# Patient Record
Sex: Female | Born: 1960 | Race: White | Hispanic: No | Smoking: Never smoker
Health system: Southern US, Community
[De-identification: ages and names within clinical notes are randomized; demographics above are authoritative.]

## PROBLEM LIST (undated history)

## (undated) DIAGNOSIS — E8809 Other disorders of plasma-protein metabolism, not elsewhere classified: Secondary | ICD-10-CM

## (undated) DIAGNOSIS — G35 Multiple sclerosis: Secondary | ICD-10-CM

## (undated) DIAGNOSIS — G35D Multiple sclerosis, unspecified: Secondary | ICD-10-CM

## (undated) DIAGNOSIS — Z87448 Personal history of other diseases of urinary system: Secondary | ICD-10-CM

## (undated) DIAGNOSIS — N816 Rectocele: Secondary | ICD-10-CM

## (undated) DIAGNOSIS — G43909 Migraine, unspecified, not intractable, without status migrainosus: Secondary | ICD-10-CM

## (undated) DIAGNOSIS — J45909 Unspecified asthma, uncomplicated: Secondary | ICD-10-CM

## (undated) HISTORY — PX: CHOLECYSTECTOMY: SHX55

## (undated) HISTORY — PX: ABDOMINAL HYSTERECTOMY: SHX81

---

## 2017-04-02 ENCOUNTER — Emergency Department (HOSPITAL_BASED_OUTPATIENT_CLINIC_OR_DEPARTMENT_OTHER)
Admission: EM | Admit: 2017-04-02 | Discharge: 2017-04-02 | Disposition: A | Discharge status: Home / Self Care | Payer: Medicare Other | Attending: Emergency Medicine | Admitting: Emergency Medicine

## 2017-04-02 ENCOUNTER — Other Ambulatory Visit: Payer: Self-pay

## 2017-04-02 ENCOUNTER — Encounter (HOSPITAL_BASED_OUTPATIENT_CLINIC_OR_DEPARTMENT_OTHER): Payer: Self-pay | Admitting: Emergency Medicine

## 2017-04-02 ENCOUNTER — Emergency Department (HOSPITAL_BASED_OUTPATIENT_CLINIC_OR_DEPARTMENT_OTHER): Payer: Medicare Other

## 2017-04-02 DIAGNOSIS — N390 Urinary tract infection, site not specified: Secondary | ICD-10-CM | POA: Insufficient documentation

## 2017-04-02 DIAGNOSIS — R109 Unspecified abdominal pain: Secondary | ICD-10-CM | POA: Diagnosis not present

## 2017-04-02 DIAGNOSIS — R52 Pain, unspecified: Secondary | ICD-10-CM

## 2017-04-02 DIAGNOSIS — R51 Headache: Secondary | ICD-10-CM

## 2017-04-02 DIAGNOSIS — R519 Headache, unspecified: Secondary | ICD-10-CM

## 2017-04-02 DIAGNOSIS — R110 Nausea: Secondary | ICD-10-CM

## 2017-04-02 HISTORY — DX: Multiple sclerosis, unspecified: G35.D

## 2017-04-02 HISTORY — DX: Migraine, unspecified, not intractable, without status migrainosus: G43.909

## 2017-04-02 HISTORY — DX: Multiple sclerosis: G35

## 2017-04-02 LAB — COMPREHENSIVE METABOLIC PANEL
ALBUMIN: 3.9 g/dL (ref 3.5–5.0)
ALK PHOS: 70 U/L (ref 38–126)
ALT: 15 U/L (ref 14–54)
AST: 21 U/L (ref 15–41)
Anion gap: 11 (ref 5–15)
BILIRUBIN TOTAL: 0.4 mg/dL (ref 0.3–1.2)
BUN: 11 mg/dL (ref 6–20)
CALCIUM: 9.2 mg/dL (ref 8.9–10.3)
CO2: 26 mmol/L (ref 22–32)
Chloride: 105 mmol/L (ref 101–111)
Creatinine, Ser: 0.74 mg/dL (ref 0.44–1.00)
GFR calc Af Amer: >60 mL/min (ref >60)
GFR calc non Af Amer: >60 mL/min (ref >60)
GLUCOSE: 100 mg/dL — AB (ref 65–99)
Potassium: 3.3 mmol/L — ABNORMAL LOW (ref 3.5–5.1)
Sodium: 142 mmol/L (ref 135–145)
TOTAL PROTEIN: 7.7 g/dL (ref 6.5–8.1)

## 2017-04-02 LAB — URINALYSIS, ROUTINE W REFLEX MICROSCOPIC
Bilirubin Urine: NEGATIVE
GLUCOSE, UA: NEGATIVE mg/dL
KETONES UR: NEGATIVE mg/dL
Nitrite: NEGATIVE
PROTEIN: NEGATIVE mg/dL
Specific Gravity, Urine: 1.015 (ref 1.005–1.030)
pH: 6 (ref 5.0–8.0)

## 2017-04-02 LAB — CK: Total CK: 59 U/L (ref 38–234)

## 2017-04-02 LAB — CBC WITH DIFFERENTIAL/PLATELET
BASOS ABS: 0 10*3/uL (ref 0.0–0.1)
BASOS PCT: 0 %
Eosinophils Absolute: 0.2 10*3/uL (ref 0.0–0.7)
Eosinophils Relative: 3 %
HEMATOCRIT: 40.6 % (ref 36.0–46.0)
HEMOGLOBIN: 12.9 g/dL (ref 12.0–15.0)
Lymphocytes Relative: 25 %
Lymphs Abs: 2.4 10*3/uL (ref 0.7–4.0)
MCH: 28.7 pg (ref 26.0–34.0)
MCHC: 31.8 g/dL (ref 30.0–36.0)
MCV: 90.4 fL (ref 78.0–100.0)
Monocytes Absolute: 0.8 10*3/uL (ref 0.1–1.0)
Monocytes Relative: 9 %
NEUTROS ABS: 6 10*3/uL (ref 1.7–7.7)
NEUTROS PCT: 63 %
Platelets: 333 10*3/uL (ref 150–400)
RBC: 4.49 MIL/uL (ref 3.87–5.11)
RDW: 14.7 % (ref 11.5–15.5)
WBC: 9.5 10*3/uL (ref 4.0–10.5)

## 2017-04-02 LAB — TROPONIN I

## 2017-04-02 LAB — URINALYSIS, MICROSCOPIC (REFLEX)

## 2017-04-02 MED ORDER — PROMETHAZINE HCL 25 MG PO TABS
25.0000 mg | ORAL_TABLET | Freq: Once | ORAL | Status: AC
  Administered 2017-04-02: 25 mg via ORAL
  Filled 2017-04-02: qty 1

## 2017-04-02 MED ORDER — DIPHENHYDRAMINE HCL 50 MG/ML IJ SOLN
25.0000 mg | Freq: Once | INTRAMUSCULAR | Status: AC
  Administered 2017-04-02: 25 mg via INTRAVENOUS
  Filled 2017-04-02: qty 1

## 2017-04-02 MED ORDER — TERCONAZOLE 0.8 % VA CREA: 1.0000 | TOPICAL_CREAM | Freq: Every day | VAGINAL | 0 refills | Status: AC

## 2017-04-02 MED ORDER — DEXAMETHASONE SODIUM PHOSPHATE 10 MG/ML IJ SOLN
10.0000 mg | Freq: Once | INTRAMUSCULAR | Status: AC
  Administered 2017-04-02: 10 mg via INTRAVENOUS
  Filled 2017-04-02: qty 1

## 2017-04-02 MED ORDER — CEPHALEXIN 500 MG PO CAPS: 500.0000 mg | ORAL_CAPSULE | Freq: Three times a day (TID) | ORAL | 0 refills | Status: AC

## 2017-04-02 MED ORDER — SODIUM CHLORIDE 0.9 % IV BOLUS (SEPSIS)
1000.0000 mL | Freq: Once | INTRAVENOUS | Status: AC
  Administered 2017-04-02: 1000 mL via INTRAVENOUS

## 2017-04-02 NOTE — ED Notes (Signed)
Pt on monitor 

## 2017-04-02 NOTE — ED Notes (Signed)
Patient reports headache 6/10.  Reports back pain remains 10/10.

## 2017-04-02 NOTE — ED Provider Notes (Signed)
[facility] EMERGENCY DEPARTMENT Provider Note   CSN: [id number] Arrival date & time: 04/02/17  1249     History   Chief Complaint Chief Complaint  Patient presents with  . Headache    HPI Valerie Juarez is a 56 y.o. female.  HPI  Reports diffuse aching, from lower head to back. Reports feels like flu but has not had fever.  Reports with coughing pain in back and head is worse. Has hx of chronic daily migraines and MS.  Wednesday night ate apples and ham, had reaction to one or the other, had itching. No nausea, vomiting or diarrhea. Took benadryl before bed. Had different type of headache then typical, aching and headache Thursday. Took toradol shot which helped headache but not aches.  Has not been eating much. Today, woke up feeling sicker.  Worsening aching.  Hx of sinus and allergy problems, cough is similar.  No shortness of breath, no diarrhea. Has had nausea.  No urinary symptoms. No fevers.  Feels sick but can't point at any particular thing.  Reports diffuse aching that is worse in flank bilaterally.  No recent medication changes.     Past Medical History:  Diagnosis Date  . Migraines   . MS (multiple sclerosis) (HCC)     There are no active problems to display for this patient.   Past Surgical History:  Procedure Laterality Date  . ABDOMINAL HYSTERECTOMY    . CHOLECYSTECTOMY      OB History    No data available       Home Medications    Prior to Admission medications   Medication Sig Start Date End Date Taking? Authorizing Provider  cetirizine (ZYRTEC) 10 MG tablet Take 10 mg by mouth daily.   Yes [provider]  clobetasol (OLUX) 0.05 % topical foam Apply topically at bedtime.   Yes [provider]  clonazePAM (KLONOPIN) 1 MG tablet Take 1 mg by mouth 2 (two) times daily as needed for anxiety (for severe headache).   Yes [provider]  diazepam (VALIUM) 5 MG tablet Take 5 mg by mouth every 6 (six) hours as needed  for anxiety.   Yes [provider]  diphenhydrAMINE (BENADRYL) 50 MG capsule Take 50 mg by mouth every 6 (six) hours as needed (anxiety and allergic reactions).   Yes [provider]  EPINEPHrine 0.3 mg/0.3 mL IJ SOAJ injection Inject into the muscle once.   Yes [provider]  esomeprazole (NEXIUM) 40 MG capsule Take 40 mg by mouth 2 (two) times daily before a meal.   Yes [provider]  estradiol (CLIMARA - DOSED IN MG/24 HR) 0.025 mg/24hr patch Place 0.25 mg onto the skin once a week.   Yes [provider]  famotidine (PEPCID) 40 MG tablet Take 40 mg by mouth as needed for heartburn or indigestion.   Yes [provider]  fluorometholone (FML) 0.1 % ophthalmic suspension Place 1 drop into both eyes daily.   Yes [provider]  fluticasone (FLOVENT DISKUS) 50 MCG/BLIST diskus inhaler Inhale 1 puff into the lungs 2 (two) times daily.   Yes [provider]  ketorolac (TORADOL) 10 MG tablet 60 mg every 6 (six) hours as needed for severe pain (this is for severe headache and is IM injection).   Yes [provider]  levalbuterol Pauline Aus(XOPENEX HFA) 45 MCG/ACT inhaler Inhale into the lungs every 4 (four) hours as needed for wheezing or shortness of breath.   Yes [provider]  levalbuterol (XOPENEX) 1.25 MG/3ML nebulizer solution Take 1.25 mg by nebulization every 4 (four) hours as needed for wheezing.   Yes [provider]  metroNIDAZOLE (METROGEL) 1 % gel Apply topically at bedtime as needed (alternate with metro cream).   Yes [provider]  montelukast (SINGULAIR) 10 MG tablet Take 10 mg by mouth at bedtime.   Yes [provider]  propranolol (INDERAL) 10 MG tablet Take 20 mg by mouth every evening.   Yes [provider]  propranolol (INDERAL) 80 MG tablet Take 80 mg by mouth daily.   Yes [provider]  ranitidine (ZANTAC) 150 MG tablet Take 150 mg by mouth 2 (two)  times daily.   Yes [provider]  tretinoin (RETIN-A) 0.025 % gel Apply topically at bedtime.   Yes [provider]  triamcinolone cream (KENALOG) 0.1 % Apply 1 application topically 2 (two) times daily.   Yes [provider]  vitamin B-12 (CYANOCOBALAMIN) 1000 MCG tablet Take 1,000 mcg by mouth daily.   Yes [provider]  VITAMIN D, ERGOCALCIFEROL, PO Take 5,000 Units by mouth daily.   Yes [provider]  cephALEXin (KEFLEX) 500 MG capsule Take 1 capsule (500 mg total) by mouth 3 (three) times daily for 10 days. 04/02/17 04/12/17  Alvira Monday, MD  terconazole (TERAZOL 3) 0.8 % vaginal cream Place 1 applicator vaginally at bedtime for 3 days. 04/02/17 04/05/17  Alvira Monday, MD    Family History No family history on file.  Social History Social History   Tobacco Use  . Smoking status: Never Smoker  . Smokeless tobacco: Never Used  Substance Use Topics  . Alcohol use: No    Frequency: Never  . Drug use: No     Allergies   Atenolol; Codeine; Contrast media [iodinated diagnostic agents]; Desyrel [trazodone]; Morphine and related; Premarin [conjugated estrogens]; Prozac [fluoxetine hcl]; and Tetanus toxoids   Review of Systems Review of Systems  Constitutional: Negative for fever.  HENT: Positive for congestion (chronic, unchanged). Negative for sore throat.   Eyes: Negative for visual disturbance.  Respiratory: Positive for cough (has chronic, unchanged). Negative for shortness of breath.   Cardiovascular: Negative for chest pain.  Gastrointestinal: Positive for nausea. Negative for abdominal pain, constipation, diarrhea and vomiting.  Genitourinary: Positive for flank pain. Negative for difficulty urinating and dysuria.  Musculoskeletal: Positive for back pain and myalgias. Negative for neck pain.  Skin: Negative for rash.  Neurological: Positive for headaches. Negative for syncope, facial asymmetry, weakness and numbness.      Physical Exam Updated Vital Signs BP 139/79   Pulse 89   Temp 98.3 F (36.8 C) (Oral)   Resp 18   Ht 5' 4.5" (1.638 m)   Wt 104.3 kg (230 lb)   SpO2 100%   BMI 38.87 kg/m   Physical Exam  Constitutional: She is oriented to person, place, and time. She appears well-developed and well-nourished. No distress.  HENT:  Head: Normocephalic and atraumatic.  Eyes: Conjunctivae and EOM are normal.  Neck: Normal range of motion.  Cardiovascular: Normal rate, regular rhythm, normal heart sounds and intact distal pulses. Exam reveals no gallop and no friction rub.  No murmur heard. Pulmonary/Chest: Effort normal and breath sounds normal. No respiratory distress. She has no wheezes. She has no rales.  Abdominal: Soft. She exhibits no distension. There is no tenderness. There is no guarding.  Bilateral CVA  Musculoskeletal: She exhibits no edema or tenderness.  Neurological: She is alert and oriented to  person, place, and time. She has normal strength. No cranial nerve deficit or sensory deficit. Coordination normal. GCS eye subscore is 4. GCS verbal subscore is 5. GCS motor subscore is 6.  Skin: Skin is warm and dry. No rash noted. She is not diaphoretic. No erythema.  Nursing note and vitals reviewed.    ED Treatments / Results  Labs (all labs ordered are listed, but only abnormal results are displayed) Labs Reviewed  COMPREHENSIVE METABOLIC PANEL - Abnormal; Notable for the following components:      Result Value   Potassium 3.3 (*)    Glucose, Bld 100 (*)    All other components within normal limits  URINALYSIS, ROUTINE W REFLEX MICROSCOPIC - Abnormal; Notable for the following components:   APPearance CLOUDY (*)    Hgb urine dipstick MODERATE (*)    Leukocytes, UA SMALL (*)    All other components within normal limits  URINALYSIS, MICROSCOPIC (REFLEX) - Abnormal; Notable for the following components:   Bacteria, UA MANY (*)    Squamous Epithelial / LPF 0-5 (*)    All  other components within normal limits  URINE CULTURE  TROPONIN I  CBC WITH DIFFERENTIAL/PLATELET  CK    EKG  EKG Interpretation  Date/Time:  Friday April 02 2017 16:05:25 EST Ventricular Rate:  74 PR Interval:    QRS Duration: 95 QT Interval:  426 QTC Calculation: 473 R Axis:   43 Text Interpretation:  Sinus rhythm Low voltage, precordial leads No significant change since last tracing Confirmed by Alvira Monday ([id number]) on 04/02/2017 5:56:19 PM       Radiology Dg Chest 2 View  Result Date: 04/02/2017 CLINICAL DATA:  MS.  Migraines.  Cough and congestion.  Body aches. EXAM: CHEST  2 VIEW COMPARISON:  No prior . FINDINGS: Mild soft tissue prominence noted along the lower aspect of the anterior mediastinum, most likely prominent mediastinal fat. CT can be obtained for confirmation. Heart size normal. Lungs are clear. No pleural effusion or pneumothorax. No acute bony abnormality. IMPRESSION: 1. Mild soft tissue fullness noted over the lower anterior mediastinum, most likely prominent mediastinal fat. CT can be obtained for confirmation. 2. No acute cardiopulmonary disease . Electronically Signed   By: Maisie Fus  Register   On: 04/02/2017 16:37    Procedures Procedures (including critical care time)  Medications Ordered in ED Medications  sodium chloride 0.9 % bolus 1,000 mL (0 mLs Intravenous Stopped 04/02/17 1811)  diphenhydrAMINE (BENADRYL) injection 25 mg (25 mg Intravenous Given 04/02/17 1607)  dexamethasone (DECADRON) injection 10 mg (10 mg Intravenous Given 04/02/17 1607)  promethazine (PHENERGAN) tablet 25 mg (25 mg Oral Given 04/02/17 1607)     Initial Impression / Assessment and Plan / ED Course  I have reviewed the triage vital signs and the nursing notes.  Pertinent labs & imaging results that were available during my care of the patient were reviewed by me and considered in my medical decision making (see chart for details).     56yo female with history of  MS, chronic headaches, presents with concern for nausea, headache, back pain, body aches.  No fever, doubt influenza. XR shows no pneumonia.  Reported aching also through chest and EKG/troponin done without acute abnormalities. No significant electrolyte abnormalities. Normal CK.  UA shows UTI. Suspect likely pyelonephritis versus other viral syndrome.  Given medication for headache, reports improved headache however presence of back pain. Not colicky pain, reports feels different than prior nephrolithiasis. Given rx for keflex. Patient discharged in stable  condition with understanding of reasons to return.   Final Clinical Impressions(s) / ED Diagnoses   Final diagnoses:  Body aches  Acute nonintractable headache, unspecified headache type  Nausea  Flank pain  Urinary tract infection without hematuria, site unspecified    ED Discharge Orders        Ordered    cephALEXin (KEFLEX) 500 MG capsule  3 times daily     04/02/17 1809    terconazole (TERAZOL 3) 0.8 % vaginal cream  Daily at bedtime     04/02/17 1816       Alvira Monday, MD 04/03/17 1258

## 2017-04-02 NOTE — ED Triage Notes (Signed)
Pt reports hx of MS and chronic daily migraines and states she feels "really sick". Pt reports a "different type headache" and generalized body aches today.

## 2017-04-03 LAB — URINE CULTURE

## 2017-04-07 ENCOUNTER — Encounter (HOSPITAL_BASED_OUTPATIENT_CLINIC_OR_DEPARTMENT_OTHER): Payer: Self-pay

## 2017-04-07 ENCOUNTER — Other Ambulatory Visit: Payer: Self-pay

## 2017-04-07 ENCOUNTER — Emergency Department (HOSPITAL_BASED_OUTPATIENT_CLINIC_OR_DEPARTMENT_OTHER)
Admission: EM | Admit: 2017-04-07 | Discharge: 2017-04-08 | Disposition: A | Discharge status: Home / Self Care | Payer: Medicare Other | Attending: Emergency Medicine | Admitting: Emergency Medicine

## 2017-04-07 DIAGNOSIS — Z79899 Other long term (current) drug therapy: Secondary | ICD-10-CM | POA: Diagnosis not present

## 2017-04-07 DIAGNOSIS — M549 Dorsalgia, unspecified: Secondary | ICD-10-CM | POA: Insufficient documentation

## 2017-04-07 DIAGNOSIS — R1030 Lower abdominal pain, unspecified: Secondary | ICD-10-CM | POA: Diagnosis present

## 2017-04-07 LAB — URINALYSIS, ROUTINE W REFLEX MICROSCOPIC
Bilirubin Urine: NEGATIVE
GLUCOSE, UA: NEGATIVE mg/dL
HGB URINE DIPSTICK: NEGATIVE
KETONES UR: NEGATIVE mg/dL
Leukocytes, UA: NEGATIVE
Nitrite: NEGATIVE
PROTEIN: NEGATIVE mg/dL
Specific Gravity, Urine: 1.01 (ref 1.005–1.030)
pH: 6.5 (ref 5.0–8.0)

## 2017-04-07 NOTE — ED Triage Notes (Addendum)
Pt c/o bilat flank pain-was dx with UTI on 12/28-states she is on meds but feels no better-NAD-slow steady gait

## 2017-04-08 ENCOUNTER — Emergency Department (HOSPITAL_BASED_OUTPATIENT_CLINIC_OR_DEPARTMENT_OTHER): Payer: Medicare Other

## 2017-04-08 LAB — CBC WITH DIFFERENTIAL/PLATELET
Basophils Absolute: 0 10*3/uL (ref 0.0–0.1)
Basophils Relative: 0 %
EOS ABS: 0.3 10*3/uL (ref 0.0–0.7)
EOS PCT: 3 %
HCT: 40 % (ref 36.0–46.0)
Hemoglobin: 13 g/dL (ref 12.0–15.0)
Lymphocytes Relative: 32 %
Lymphs Abs: 3.1 10*3/uL (ref 0.7–4.0)
MCH: 29 pg (ref 26.0–34.0)
MCHC: 32.5 g/dL (ref 30.0–36.0)
MCV: 89.3 fL (ref 78.0–100.0)
MONO ABS: 0.7 10*3/uL (ref 0.1–1.0)
Monocytes Relative: 7 %
Neutro Abs: 5.7 10*3/uL (ref 1.7–7.7)
Neutrophils Relative %: 58 %
PLATELETS: 318 10*3/uL (ref 150–400)
RBC: 4.48 MIL/uL (ref 3.87–5.11)
RDW: 14.5 % (ref 11.5–15.5)
WBC: 9.8 10*3/uL (ref 4.0–10.5)

## 2017-04-08 LAB — COMPREHENSIVE METABOLIC PANEL
ALT: 16 U/L (ref 14–54)
ANION GAP: 11 (ref 5–15)
AST: 19 U/L (ref 15–41)
Albumin: 3.6 g/dL (ref 3.5–5.0)
Alkaline Phosphatase: 67 U/L (ref 38–126)
BUN: 14 mg/dL (ref 6–20)
CHLORIDE: 101 mmol/L (ref 101–111)
CO2: 26 mmol/L (ref 22–32)
Calcium: 9.3 mg/dL (ref 8.9–10.3)
Creatinine, Ser: 0.86 mg/dL (ref 0.44–1.00)
GFR calc Af Amer: >60 mL/min (ref >60)
Glucose, Bld: 108 mg/dL — ABNORMAL HIGH (ref 65–99)
POTASSIUM: 3.6 mmol/L (ref 3.5–5.1)
SODIUM: 138 mmol/L (ref 135–145)
Total Bilirubin: 0.5 mg/dL (ref 0.3–1.2)
Total Protein: 7.2 g/dL (ref 6.5–8.1)

## 2017-04-08 MED ORDER — ONDANSETRON 8 MG PO TBDP
8.0000 mg | ORAL_TABLET | Freq: Once | ORAL | Status: AC
Start: 2017-04-08 — End: 2017-04-08
  Administered 2017-04-08: 8 mg via ORAL
  Filled 2017-04-08: qty 1

## 2017-04-08 MED ORDER — HYDROCODONE-ACETAMINOPHEN 5-325 MG PO TABS: 1.0000 | ORAL_TABLET | Freq: Four times a day (QID) | ORAL | 0 refills | Status: AC | PRN

## 2017-04-08 NOTE — ED Notes (Signed)
C/o bilateral flank pain rt worse than left,  radiating to rt upper back and rt side, w nausea and chills off and on  Was seen here dec 26 dx w uti and started on meds  Pt states she is no better

## 2017-04-08 NOTE — ED Notes (Signed)
Returned pt. Phone call for results of her urine culture.  She did not answer, message left for her to call . 04/08/2017/ 14:18

## 2017-04-08 NOTE — ED Provider Notes (Signed)
[facility] Provider Note: Valerie Dell, MD, FACEP  CSN: [id number] MRN: [id number] ARRIVAL: 04/07/17 at 2159 ROOM: [facility]   CHIEF COMPLAINT  Flank Pain   HISTORY OF PRESENT ILLNESS  04/08/17 1:08 AM Valerie Juarez is a 57 y.o. female who was seen in the ED on the 28th for flank pain and vaguely described feelings of being sick.  A workup will suggest a urinary tract infection and she was treated with Keflex.  She was also treated with steroids for chronic daily headaches.  She has had improvement in her headaches but continues to have bilateral flank pain which worsened yesterday.  The flank pain is located more medial than is typical for urinary symptoms.  The pain is dull and she rates it a 9 out of 10.  Pain is worse with movement, palpation or deep breathing.  She is not short of breath.  She has not had a fever.  The pain does make her nauseated.  She continues to feel "sick" but acknowledges she cannot verbalize what she means by this.  She has not had a fever.  She has also given herself Toradol shots for treatment of her headaches.  These have been beneficial to her head but have not relieved her back pain.  Consultation with the [facility] state controlled substances database reveals the patient has received no opioid prescriptions in the past 2 years.   Past Medical History:  Diagnosis Date  . Migraines   . MS (multiple sclerosis) (HCC)     Past Surgical History:  Procedure Laterality Date  . ABDOMINAL HYSTERECTOMY    . CHOLECYSTECTOMY      No family history on file.  Social History   Tobacco Use  . Smoking status: Never Smoker  . Smokeless tobacco: Never Used  Substance Use Topics  . Alcohol use: No    Frequency: Never  . Drug use: No    Prior to Admission medications   Medication Sig Start Date End Date Taking? Authorizing Provider  cephALEXin (KEFLEX) 500 MG capsule Take 1 capsule (500 mg total) by mouth 3 (three) times daily for 10  days. 04/02/17 04/12/17  Alvira Monday, MD  cetirizine (ZYRTEC) 10 MG tablet Take 10 mg by mouth daily.    [provider]  clobetasol (OLUX) 0.05 % topical foam Apply topically at bedtime.    [provider]  clonazePAM (KLONOPIN) 1 MG tablet Take 1 mg by mouth 2 (two) times daily as needed for anxiety (for severe headache).    [provider]  diazepam (VALIUM) 5 MG tablet Take 5 mg by mouth every 6 (six) hours as needed for anxiety.    [provider]  diphenhydrAMINE (BENADRYL) 50 MG capsule Take 50 mg by mouth every 6 (six) hours as needed (anxiety and allergic reactions).    [provider]  EPINEPHrine 0.3 mg/0.3 mL IJ SOAJ injection Inject into the muscle once.    [provider]  esomeprazole (NEXIUM) 40 MG capsule Take 40 mg by mouth 2 (two) times daily before a meal.    [provider]  estradiol (CLIMARA - DOSED IN MG/24 HR) 0.025 mg/24hr patch Place 0.25 mg onto the skin once a week.    [provider]  famotidine (PEPCID) 40 MG tablet Take 40 mg by mouth as needed for heartburn or indigestion.    [provider]  fluorometholone (FML) 0.1 % ophthalmic suspension Place 1 drop into both eyes daily.    [provider]  fluticasone (FLOVENT DISKUS) 50 MCG/BLIST diskus inhaler Inhale 1 puff into the lungs 2 (two) times daily.    [provider]  ketorolac (TORADOL) 10 MG tablet 60 mg every 6 (six) hours as needed for severe pain (this is for severe headache and is IM injection).    [provider]  levalbuterol Pauline Aus HFA) 45 MCG/ACT inhaler Inhale into the lungs every 4 (four) hours as needed for wheezing or shortness of breath.    [provider]  levalbuterol Pauline Aus) 1.25 MG/3ML nebulizer solution Take 1.25 mg by nebulization every 4 (four) hours as needed for wheezing.    [provider]  metroNIDAZOLE (METROGEL) 1 % gel Apply topically at bedtime as needed  (alternate with metro cream).    [provider]  montelukast (SINGULAIR) 10 MG tablet Take 10 mg by mouth at bedtime.    [provider]  propranolol (INDERAL) 10 MG tablet Take 20 mg by mouth every evening.    [provider]  propranolol (INDERAL) 80 MG tablet Take 80 mg by mouth daily.    [provider]  ranitidine (ZANTAC) 150 MG tablet Take 150 mg by mouth 2 (two) times daily.    [provider]  tretinoin (RETIN-A) 0.025 % gel Apply topically at bedtime.    [provider]  triamcinolone cream (KENALOG) 0.1 % Apply 1 application topically 2 (two) times daily.    [provider]  vitamin B-12 (CYANOCOBALAMIN) 1000 MCG tablet Take 1,000 mcg by mouth daily.    [provider]  VITAMIN D, ERGOCALCIFEROL, PO Take 5,000 Units by mouth daily.    [provider]    Allergies Atenolol; Codeine; Contrast media [iodinated diagnostic agents]; Desyrel [trazodone]; Morphine and related; Premarin [conjugated estrogens]; Prozac [fluoxetine hcl]; and Tetanus toxoids   REVIEW OF SYSTEMS  Negative except as noted here or in the History of Present Illness.   PHYSICAL EXAMINATION  Initial Vital Signs Blood pressure 134/63, pulse 87, temperature 98.1 F (36.7 C), temperature source Oral, resp. rate 16, height 5\' 4"  (1.626 m), weight 104.3 kg (230 lb), SpO2 99 %.  Examination General: Well-developed, well-nourished female in no acute distress; appearance consistent with age of record HENT: normocephalic; atraumatic Eyes: pupils equal, round and reactive to light; extraocular muscles intact Neck: supple Heart: regular rate and rhythm Lungs: clear to auscultation bilaterally Abdomen: soft; nondistended; nontender; bowel sounds present Back: Bilateral paraspinal mid back tenderness Extremities: No deformity; full range of motion; pulses normal Neurologic: Awake, alert and oriented; motor function intact in all  extremities and symmetric; no facial droop Skin: Warm and dry Psychiatric: Normal mood and affect   RESULTS  Summary of this visit's results, reviewed by myself:   EKG Interpretation  Date/Time:    Ventricular Rate:    PR Interval:    QRS Duration:   QT Interval:    QTC Calculation:   R Axis:     Text Interpretation:        Laboratory Studies: Results for orders placed or performed during the hospital encounter of 04/07/17 (from the past 24 hour(s))  Urinalysis, Routine w reflex microscopic     Status: None   Collection Time: 04/07/17 10:25 PM  Result Value Ref Range   Color, Urine YELLOW YELLOW   APPearance CLEAR CLEAR   Specific Gravity, Urine 1.010 1.005 - 1.030   pH 6.5 5.0 - 8.0   Glucose, UA NEGATIVE NEGATIVE mg/dL   Hgb urine dipstick NEGATIVE NEGATIVE   Bilirubin  Urine NEGATIVE NEGATIVE   Ketones, ur NEGATIVE NEGATIVE mg/dL   Protein, ur NEGATIVE NEGATIVE mg/dL   Nitrite NEGATIVE NEGATIVE   Leukocytes, UA NEGATIVE NEGATIVE  CBC with Differential/Platelet     Status: None   Collection Time: 04/08/17  1:55 AM  Result Value Ref Range   WBC 9.8 4.0 - 10.5 K/uL   RBC 4.48 3.87 - 5.11 MIL/uL   Hemoglobin 13.0 12.0 - 15.0 g/dL   HCT 33.2 95.1 - 88.4 %   MCV 89.3 78.0 - 100.0 fL   MCH 29.0 26.0 - 34.0 pg   MCHC 32.5 30.0 - 36.0 g/dL   RDW 16.6 06.3 - 01.6 %   Platelets 318 150 - 400 K/uL   Neutrophils Relative % 58 %   Neutro Abs 5.7 1.7 - 7.7 K/uL   Lymphocytes Relative 32 %   Lymphs Abs 3.1 0.7 - 4.0 K/uL   Monocytes Relative 7 %   Monocytes Absolute 0.7 0.1 - 1.0 K/uL   Eosinophils Relative 3 %   Eosinophils Absolute 0.3 0.0 - 0.7 K/uL   Basophils Relative 0 %   Basophils Absolute 0.0 0.0 - 0.1 K/uL  Comprehensive metabolic panel     Status: Abnormal   Collection Time: 04/08/17  1:55 AM  Result Value Ref Range   Sodium 138 135 - 145 mmol/L   Potassium 3.6 3.5 - 5.1 mmol/L   Chloride 101 101 - 111 mmol/L   CO2 26 22 - 32 mmol/L   Glucose, Bld 108  (H) 65 - 99 mg/dL   BUN 14 6 - 20 mg/dL   Creatinine, Ser 0.10 0.44 - 1.00 mg/dL   Calcium 9.3 8.9 - 93.2 mg/dL   Total Protein 7.2 6.5 - 8.1 g/dL   Albumin 3.6 3.5 - 5.0 g/dL   AST 19 15 - 41 U/L   ALT 16 14 - 54 U/L   Alkaline Phosphatase 67 38 - 126 U/L   Total Bilirubin 0.5 0.3 - 1.2 mg/dL   GFR calc non Af Amer >60 >60 mL/min   GFR calc Af Amer >60 >60 mL/min   Anion gap 11 5 - 15   Imaging Studies: Ct Renal Stone Study  Result Date: 04/08/2017 CLINICAL DATA:  Bilateral flank pain, greater on the right for several days. On medication for urinary tract infections with no relief. Nausea, chills. Previous hysterectomy and bladder surgery. Cholecystectomy. EXAM: CT ABDOMEN AND PELVIS WITHOUT CONTRAST TECHNIQUE: Multidetector CT imaging of the abdomen and pelvis was performed following the standard protocol without IV contrast. COMPARISON:  None. FINDINGS: Lower chest: The lung bases are clear. Hepatobiliary: No focal liver abnormality is seen. Status post cholecystectomy. No biliary dilatation. Pancreas: Unremarkable. No pancreatic ductal dilatation or surrounding inflammatory changes. Spleen: Normal in size without focal abnormality. Adrenals/Urinary Tract: Adrenal glands are unremarkable. Kidneys are normal, without renal calculi, focal lesion, or hydronephrosis. There is a tiny gas bubble in the bladder. This is nonspecific but could indicate infection. No bladder wall thickening. Stomach/Bowel: Stomach is within normal limits. Appendix appears normal. Diverticula in the sigmoid colon. No evidence of bowel wall thickening, distention, or inflammatory changes. Vascular/Lymphatic: Aortic atherosclerosis. No enlarged abdominal or pelvic lymph nodes. Reproductive: Status post hysterectomy. No adnexal masses. Other: No abdominal wall hernia or abnormality. No abdominopelvic ascites. Musculoskeletal: No acute or significant osseous findings. IMPRESSION: 1. No renal or ureteral stone or obstruction. 2.  Tiny dot of gas in the bladder is nonspecific but could indicate infection. No bladder wall thickening. 3. Colonic  diverticula without evidence of diverticulitis. No evidence of bowel obstruction or inflammation. 4. Mild aortic atherosclerosis. Electronically Signed   By: Burman Nieves M.D.   On: 04/08/2017 01:50    ED COURSE  Nursing notes and initial vitals signs, including pulse oximetry, reviewed.  Vitals:   04/07/17 2206 04/07/17 2207 04/08/17 0208  BP: 134/63  129/65  Pulse: 87  71  Resp: 16  18  Temp: 98.1 F (36.7 C)    TempSrc: Oral    SpO2: 99%  98%  Weight:  104.3 kg (230 lb)   Height:  5\' 4"  (1.626 m)    3:06 AM Patient advised of reassuring diagnostic studies.  I suspect her pain is likely musculoskeletal given its location.  PROCEDURES    ED DIAGNOSES     ICD-10-CM   1. Mid back pain M54.9        Farra Nikolic, Jonny Ruiz, MD 04/08/17 [id number]

## 2017-04-09 LAB — URINE CULTURE: CULTURE: NO GROWTH

## 2017-06-20 ENCOUNTER — Emergency Department (HOSPITAL_BASED_OUTPATIENT_CLINIC_OR_DEPARTMENT_OTHER)
Admission: EM | Admit: 2017-06-20 | Discharge: 2017-06-20 | Disposition: A | Discharge status: Home / Self Care | Payer: Medicare Other | Attending: Emergency Medicine | Admitting: Emergency Medicine

## 2017-06-20 ENCOUNTER — Encounter (HOSPITAL_BASED_OUTPATIENT_CLINIC_OR_DEPARTMENT_OTHER): Payer: Self-pay | Admitting: *Deleted

## 2017-06-20 ENCOUNTER — Other Ambulatory Visit: Payer: Self-pay

## 2017-06-20 DIAGNOSIS — J45909 Unspecified asthma, uncomplicated: Secondary | ICD-10-CM | POA: Insufficient documentation

## 2017-06-20 DIAGNOSIS — R197 Diarrhea, unspecified: Secondary | ICD-10-CM

## 2017-06-20 DIAGNOSIS — Z79899 Other long term (current) drug therapy: Secondary | ICD-10-CM | POA: Insufficient documentation

## 2017-06-20 HISTORY — DX: Other disorders of plasma-protein metabolism, not elsewhere classified: E88.09

## 2017-06-20 HISTORY — DX: Personal history of other diseases of urinary system: Z87.448

## 2017-06-20 HISTORY — DX: Unspecified asthma, uncomplicated: J45.909

## 2017-06-20 HISTORY — DX: Rectocele: N81.6

## 2017-06-20 LAB — I-STAT VENOUS BLOOD GAS, ED
ACID-BASE DEFICIT: 1 mmol/L (ref 0.0–2.0)
Bicarbonate: 22.4 mmol/L (ref 20.0–28.0)
O2 SAT: 78 %
PO2 VEN: 40 mmHg (ref 32.0–45.0)
Patient temperature: 98
TCO2: 23 mmol/L (ref 22–32)
pCO2, Ven: 33.3 mmHg — ABNORMAL LOW (ref 44.0–60.0)
pH, Ven: 7.435 — ABNORMAL HIGH (ref 7.250–7.430)

## 2017-06-20 LAB — COMPREHENSIVE METABOLIC PANEL
ALK PHOS: 59 U/L (ref 38–126)
ALT: 26 U/L (ref 14–54)
AST: 31 U/L (ref 15–41)
Albumin: 3.7 g/dL (ref 3.5–5.0)
Anion gap: 11 (ref 5–15)
BUN: 8 mg/dL (ref 6–20)
CALCIUM: 8.9 mg/dL (ref 8.9–10.3)
CO2: 22 mmol/L (ref 22–32)
CREATININE: 0.8 mg/dL (ref 0.44–1.00)
Chloride: 104 mmol/L (ref 101–111)
Glucose, Bld: 123 mg/dL — ABNORMAL HIGH (ref 65–99)
Potassium: 3.5 mmol/L (ref 3.5–5.1)
Sodium: 137 mmol/L (ref 135–145)
Total Bilirubin: 0.6 mg/dL (ref 0.3–1.2)
Total Protein: 7.4 g/dL (ref 6.5–8.1)

## 2017-06-20 LAB — CBC WITH DIFFERENTIAL/PLATELET
BASOS ABS: 0 10*3/uL (ref 0.0–0.1)
Basophils Relative: 0 %
Eosinophils Absolute: 0.2 10*3/uL (ref 0.0–0.7)
Eosinophils Relative: 3 %
HEMATOCRIT: 40.8 % (ref 36.0–46.0)
HEMOGLOBIN: 13.5 g/dL (ref 12.0–15.0)
LYMPHS ABS: 1.7 10*3/uL (ref 0.7–4.0)
Lymphocytes Relative: 27 %
MCH: 29.3 pg (ref 26.0–34.0)
MCHC: 33.1 g/dL (ref 30.0–36.0)
MCV: 88.5 fL (ref 78.0–100.0)
Monocytes Absolute: 0.8 10*3/uL (ref 0.1–1.0)
Monocytes Relative: 12 %
NEUTROS ABS: 3.8 10*3/uL (ref 1.7–7.7)
NEUTROS PCT: 58 %
PLATELETS: 260 10*3/uL (ref 150–400)
RBC: 4.61 MIL/uL (ref 3.87–5.11)
RDW: 14.6 % (ref 11.5–15.5)
WBC: 6.5 10*3/uL (ref 4.0–10.5)

## 2017-06-20 LAB — LIPASE, BLOOD: LIPASE: 39 U/L (ref 11–51)

## 2017-06-20 MED ORDER — SODIUM CHLORIDE 0.9 % IV BOLUS (SEPSIS)
1000.0000 mL | Freq: Once | INTRAVENOUS | Status: AC
  Administered 2017-06-20: 1000 mL via INTRAVENOUS

## 2017-06-20 MED ORDER — ONDANSETRON HCL 4 MG/2ML IJ SOLN
4.0000 mg | Freq: Once | INTRAMUSCULAR | Status: AC
  Administered 2017-06-20: 4 mg via INTRAVENOUS
  Filled 2017-06-20: qty 2

## 2017-06-20 MED ORDER — ONDANSETRON 4 MG PO TBDP: 4.0000 mg | ORAL_TABLET | Freq: Three times a day (TID) | ORAL | 0 refills | Status: AC | PRN

## 2017-06-20 MED ORDER — GI COCKTAIL ~~LOC~~
30.0000 mL | Freq: Once | ORAL | Status: DC
  Filled 2017-06-20: qty 30

## 2017-06-20 NOTE — ED Triage Notes (Signed)
Pt reports n/v/d since Friday. Now is just having diarrhea. Reports 20 episodes last night. Pt has been on 2 different antibiotics in the last 2 weeks. Pt has hx of MS

## 2017-06-20 NOTE — ED Notes (Signed)
Patient stated that she felt a lot better after IVF infusion.

## 2017-06-20 NOTE — ED Provider Notes (Signed)
[facility] EMERGENCY DEPARTMENT Provider Note  CSN: [id number] Arrival date & time: 06/20/17 1524  Chief Complaint(s) Diarrhea  HPI Valerie Juarez is a 57 y.o. female   The history is provided by the patient.  Diarrhea   This is a new problem. Episode onset: 3 days. The problem occurs more than 10 times per day. The problem has not changed since onset.The stool consistency is described as watery. Maximum temperature: +fever at home on day 1; now resolved. Associated symptoms include abdominal pain (epigastric burning), vomiting (NBNB emesis on day 1 and 2; now resolved), chills and myalgias. Pertinent negatives include no URI and no cough. She has tried a change of diet for the symptoms. The treatment provided no relief.   Pt reports that this feels like prior Norovirus infection several years ago.  Patient endorses recent antibiotic use in the last couple weeks for sinusitis.  States that she was on azithromycin without relief and then was placed on amoxicillin.  She completed the course several days before she began having the GI symptoms.  Past Medical History Past Medical History:  Diagnosis Date  . Asthma   . History of cystocele   . Migraines   . MS (multiple sclerosis) (HCC)   . Pseudocholinesterase deficiency   . Rectocele    There are no active problems to display for this patient.  Home Medication(s) Prior to Admission medications   Medication Sig Start Date End Date Taking? Authorizing Provider  cetirizine (ZYRTEC) 10 MG tablet Take 10 mg by mouth daily.    [provider]  clobetasol (OLUX) 0.05 % topical foam Apply topically at bedtime.    [provider]  clonazePAM (KLONOPIN) 1 MG tablet Take 1 mg by mouth 2 (two) times daily as needed for anxiety (for severe headache).    [provider]  diazepam (VALIUM) 5 MG tablet Take 5 mg by mouth every 6 (six) hours as needed for anxiety.    [provider]  diphenhydrAMINE  (BENADRYL) 50 MG capsule Take 50 mg by mouth every 6 (six) hours as needed (anxiety and allergic reactions).    [provider]  EPINEPHrine 0.3 mg/0.3 mL IJ SOAJ injection Inject into the muscle once.    [provider]  esomeprazole (NEXIUM) 40 MG capsule Take 40 mg by mouth 2 (two) times daily before a meal.    [provider]  estradiol (CLIMARA - DOSED IN MG/24 HR) 0.025 mg/24hr patch Place 0.25 mg onto the skin once a week.    [provider]  famotidine (PEPCID) 40 MG tablet Take 40 mg by mouth as needed for heartburn or indigestion.    [provider]  fluorometholone (FML) 0.1 % ophthalmic suspension Place 1 drop into both eyes daily.    [provider]  fluticasone (FLOVENT DISKUS) 50 MCG/BLIST diskus inhaler Inhale 1 puff into the lungs 2 (two) times daily.    [provider]  HYDROcodone-acetaminophen (NORCO) 5-325 MG tablet Take 1 tablet by mouth every 6 (six) hours as needed (for pain). 04/08/17   Molpus, John, MD  ketorolac (TORADOL) 10 MG tablet 60 mg every 6 (six) hours as needed for severe pain (this is for severe headache and is IM injection).    [provider]  levalbuterol Pauline Aus HFA) 45 MCG/ACT inhaler Inhale into the lungs every 4 (four) hours as needed for wheezing or shortness of breath.    [provider]  levalbuterol Pauline Aus) 1.25 MG/3ML nebulizer solution Take 1.25 mg  by nebulization every 4 (four) hours as needed for wheezing.    [provider]  metroNIDAZOLE (METROGEL) 1 % gel Apply topically at bedtime as needed (alternate with metro cream).    [provider]  montelukast (SINGULAIR) 10 MG tablet Take 10 mg by mouth at bedtime.    [provider]  propranolol (INDERAL) 10 MG tablet Take 20 mg by mouth every evening.    [provider]  propranolol (INDERAL) 80 MG tablet Take 80 mg by mouth daily.    [provider]  ranitidine (ZANTAC) 150 MG  tablet Take 150 mg by mouth 2 (two) times daily.    [provider]  tretinoin (RETIN-A) 0.025 % gel Apply topically at bedtime.    [provider]  triamcinolone cream (KENALOG) 0.1 % Apply 1 application topically 2 (two) times daily.    [provider]  vitamin B-12 (CYANOCOBALAMIN) 1000 MCG tablet Take 1,000 mcg by mouth daily.    [provider]  VITAMIN D, ERGOCALCIFEROL, PO Take 5,000 Units by mouth daily.    [provider]                                                                                                                                    Past Surgical History Past Surgical History:  Procedure Laterality Date  . ABDOMINAL HYSTERECTOMY    . CHOLECYSTECTOMY     Family History No family history on file.  Social History Social History   Tobacco Use  . Smoking status: Never Smoker  . Smokeless tobacco: Never Used  Substance Use Topics  . Alcohol use: No    Frequency: Never  . Drug use: No   Allergies Atenolol; Codeine; Contrast media [iodinated diagnostic agents]; Desyrel [trazodone]; Morphine and related; Premarin [conjugated estrogens]; Prozac [fluoxetine hcl]; Tetanus toxoids; and Vancomycin  Review of Systems Review of Systems  Constitutional: Positive for chills and fatigue.  Respiratory: Negative for cough.   Gastrointestinal: Positive for abdominal pain (epigastric burning), diarrhea and vomiting (NBNB emesis on day 1 and 2; now resolved).  Musculoskeletal: Positive for myalgias.  Neurological: Positive for light-headedness.   All other systems are reviewed and are negative for acute change except as noted in the HPI  Physical Exam Vital Signs  I have reviewed the triage vital signs BP (!) 136/98 (BP Location: Right Arm)   Pulse 86   Temp 98.7 F (37.1 C) (Oral)   Resp (!) 30   Ht 5\' 4"  (1.626 m)   Wt 104.3 kg (230 lb)   SpO2 100%   BMI 39.48 kg/m   Physical Exam  Constitutional: She is oriented  to person, place, and time. She appears well-developed and well-nourished. No distress.  HENT:  Head: Normocephalic and atraumatic.  Right Ear: External ear normal.  Left Ear: External ear normal.  Nose: Nose normal.  Eyes: Conjunctivae and EOM are normal. No scleral icterus.  Neck:  Normal range of motion and phonation normal.  Cardiovascular: Normal rate and regular rhythm.  Pulmonary/Chest: Effort normal. No stridor. No respiratory distress.  Abdominal: She exhibits no distension. There is tenderness (mild discomfort) in the epigastric area. There is no rigidity, no rebound, no guarding and no CVA tenderness.  Musculoskeletal: Normal range of motion. She exhibits no edema.  Neurological: She is alert and oriented to person, place, and time.  Skin: She is not diaphoretic.  Psychiatric: She has a normal mood and affect. Her behavior is normal.  Vitals reviewed.   ED Results and Treatments Labs (all labs ordered are listed, but only abnormal results are displayed) Labs Reviewed  COMPREHENSIVE METABOLIC PANEL - Abnormal; Notable for the following components:      Result Value   Glucose, Bld 123 (*)    All other components within normal limits  I-STAT VENOUS BLOOD GAS, ED - Abnormal; Notable for the following components:   pH, Ven 7.435 (*)    pCO2, Ven 33.3 (*)    All other components within normal limits  C DIFFICILE QUICK SCREEN W PCR REFLEX  GASTROINTESTINAL PANEL BY PCR, STOOL (REPLACES STOOL CULTURE)  CBC WITH DIFFERENTIAL/PLATELET  LIPASE, BLOOD  BLOOD GAS, VENOUS                                                                                                                         EKG  EKG Interpretation  Date/Time:  Sunday June 20 2017 16:26:32 EDT Ventricular Rate:  73 PR Interval:    QRS Duration: 83 QT Interval:  551 QTC Calculation: 608 R Axis:   33 Text Interpretation:  Sinus rhythm Atrial premature complex Prolonged QT interval No significant change since  last tracing Confirmed by Drema Pry [phone]) on 06/20/2017 5:26:02 PM      Radiology No results found. Pertinent labs & imaging results that were available during my care of the patient were reviewed by me and considered in my medical decision making (see chart for details).  Medications Ordered in ED Medications  gi cocktail (Maalox,Lidocaine,Donnatal) (30 mLs Oral Not Given 06/20/17 1630)  ondansetron (ZOFRAN) injection 4 mg (4 mg Intravenous Given 06/20/17 1630)  sodium chloride 0.9 % bolus 1,000 mL (0 mLs Intravenous Stopped 06/20/17 1728)  Procedures Procedures  (including critical care time)  Medical Decision Making / ED Course I have reviewed the nursing notes for this encounter and the patient's prior records (if available in EHR or on provided paperwork).    57 y.o. female presents with vomiting, diarrhea, and fever for 3 days. No known suspicious food intake. + Abx use recently.  adequate oral tolerance. Rest of history as above.  Patient appears well, not in distress, and with no signs of toxicity or dehydration. Abdomen benign. Rest of the exam as above  Most consistent with viral gastroenteritis, but considering C.diff given recent Abx use.  Labs grossly reassuring without leukocytosis, significant electrolyte derangements or renal insufficiency.  Stool analysis and C. difficile sent.  Given the lack of abdominal tenderness or elevated leukocytosis, it is unlikely that this is C. difficile however will follow up on the results.   Doubt appendicitis, diverticulitis, severe colitis, dysentery.    Patient given IV fluids.   Able to tolerate oral intake in the ED.  Discussed symptomatic treatment with the patient and they will follow closely with their PCP.   Final Clinical Impression(s) / ED Diagnoses Final diagnoses:  Diarrhea,  unspecified type    Disposition: Discharge  Condition: Good  I have discussed the results, Dx and Tx plan with the patient who expressed understanding and agree(s) with the plan. Discharge instructions discussed at great length. The patient was given strict return precautions who verbalized understanding of the instructions. No further questions at time of discharge.    ED Discharge Orders    None       Follow Up: Primary care provider  Schedule an appointment as soon as possible for a visit  in 5-7 days, If symptoms do not improve or  worsen     This chart was dictated using voice recognition software.  Despite best efforts to proofread,  errors can occur which can change the documentation meaning.   Nira Conn, MD 06/20/17 2003

## 2017-06-20 NOTE — ED Notes (Signed)
Pt and FM given d/c instructions as per chart. rx x 1. Verbalizes understanding. No questions.

## 2017-06-21 ENCOUNTER — Telehealth (HOSPITAL_BASED_OUTPATIENT_CLINIC_OR_DEPARTMENT_OTHER): Payer: Self-pay | Admitting: Emergency Medicine

## 2017-06-21 LAB — GASTROINTESTINAL PANEL BY PCR, STOOL (REPLACES STOOL CULTURE)
ADENOVIRUS F40/41: NOT DETECTED
ASTROVIRUS: NOT DETECTED
Campylobacter species: NOT DETECTED
Cryptosporidium: NOT DETECTED
Cyclospora cayetanensis: NOT DETECTED
ENTAMOEBA HISTOLYTICA: NOT DETECTED
ENTEROAGGREGATIVE E COLI (EAEC): NOT DETECTED
ENTEROTOXIGENIC E COLI (ETEC): NOT DETECTED
Enteropathogenic E coli (EPEC): NOT DETECTED
GIARDIA LAMBLIA: NOT DETECTED
NOROVIRUS GI/GII: DETECTED — AB
Plesimonas shigelloides: NOT DETECTED
Rotavirus A: NOT DETECTED
Salmonella species: NOT DETECTED
Sapovirus (I, II, IV, and V): NOT DETECTED
Shiga like toxin producing E coli (STEC): NOT DETECTED
Shigella/Enteroinvasive E coli (EIEC): NOT DETECTED
Vibrio cholerae: NOT DETECTED
Vibrio species: NOT DETECTED
Yersinia enterocolitica: NOT DETECTED

## 2017-06-21 LAB — C DIFFICILE QUICK SCREEN W PCR REFLEX
C DIFFICLE (CDIFF) ANTIGEN: NEGATIVE
C Diff interpretation: NOT DETECTED
C Diff toxin: NEGATIVE

## 2017-06-22 NOTE — ED Notes (Signed)
06/22/2017, 16:07, Results reviewed with pt. She verbalized understanding of results .  All questions answered.

## 2019-08-03 IMAGING — CT CT RENAL STONE PROTOCOL
2 of 4 series · 16 of 46 positions shown, 18 images · non-contrast
Comparison: None.

CLINICAL DATA: Bilateral flank pain, greater on the right for
several days. On medication for urinary tract infections with no
relief. Nausea, chills. Previous hysterectomy and bladder surgery.
Cholecystectomy.

EXAM:
CT ABDOMEN AND PELVIS WITHOUT CONTRAST
TECHNIQUE: Multidetector CT imaging of the abdomen and pelvis was performed
following the standard protocol without IV contrast.

[Series 2: axial st · axial · 0.90mm/px · z∈[-490,-30]mm · 13 of 101 slices shown, 15 images]
[im 5/101  soft-tissue]
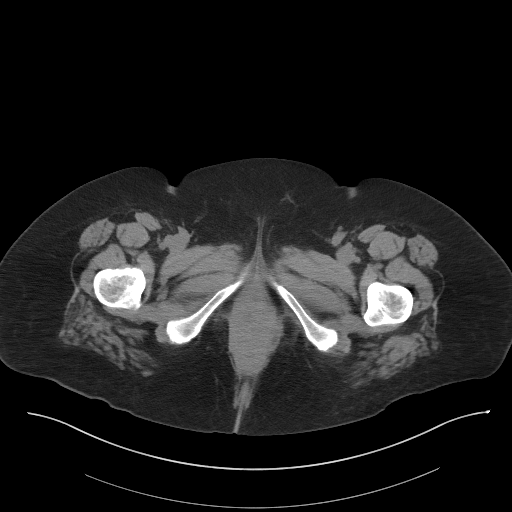
[im 5/101  bone]
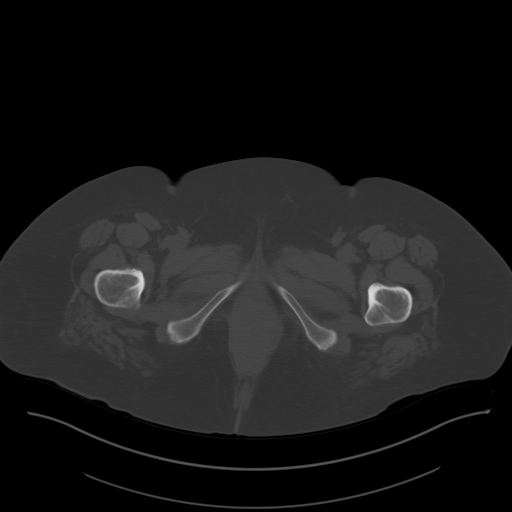
[im 13/101  soft-tissue]
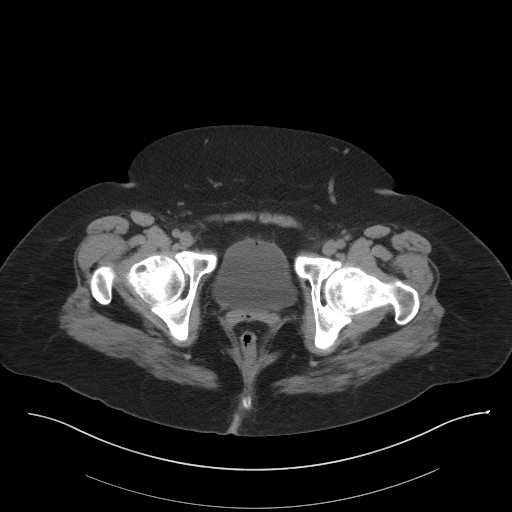
[im 21/101  soft-tissue]
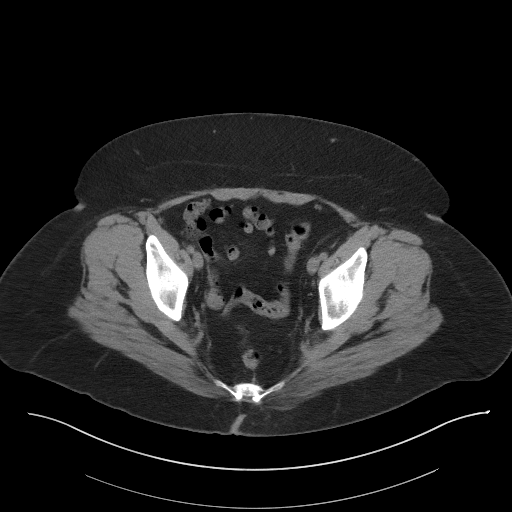
[im 29/101  soft-tissue]
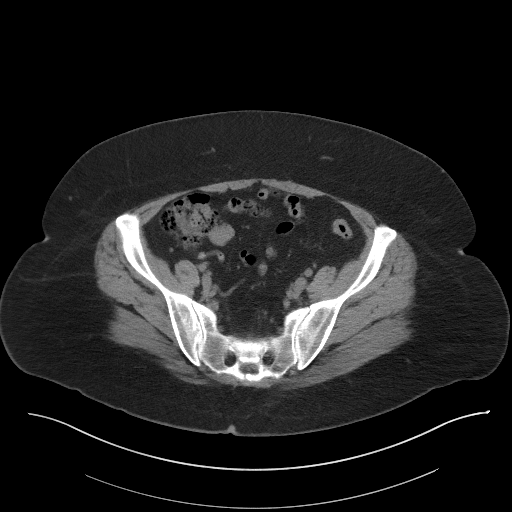
[im 37/101  soft-tissue]
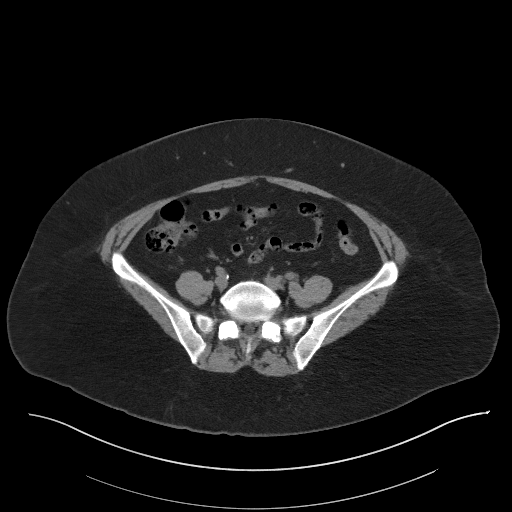
[im 45/101  soft-tissue]
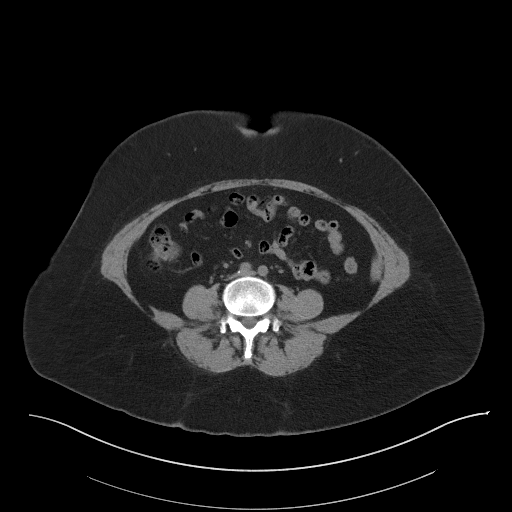
[im 53/101  soft-tissue]
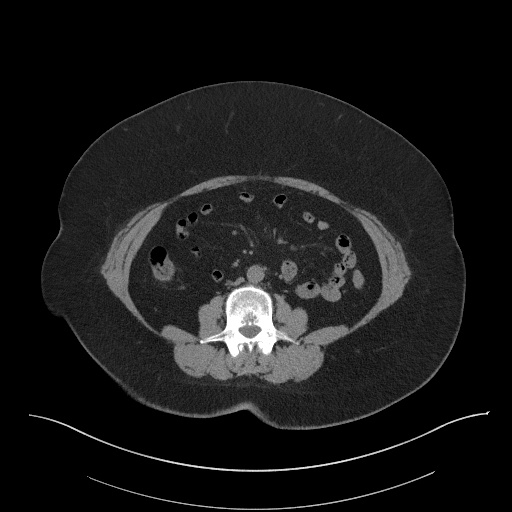
[im 57/101  soft-tissue]
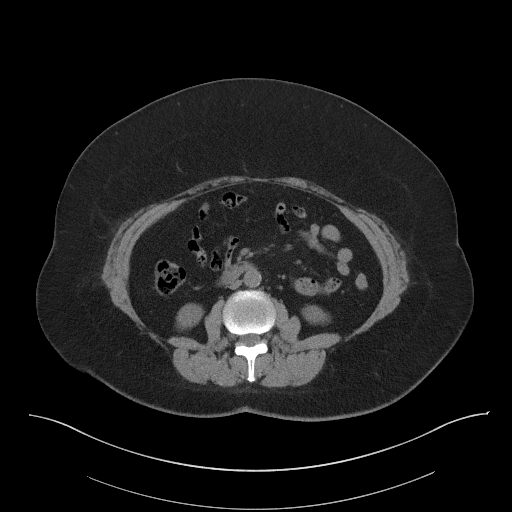
[im 65/101  soft-tissue]
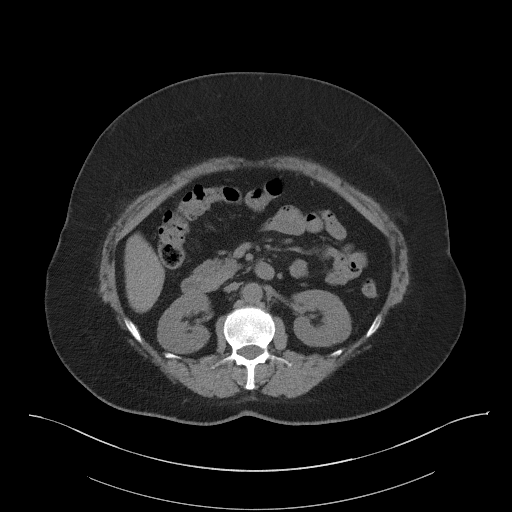
[im 65/101  bone]
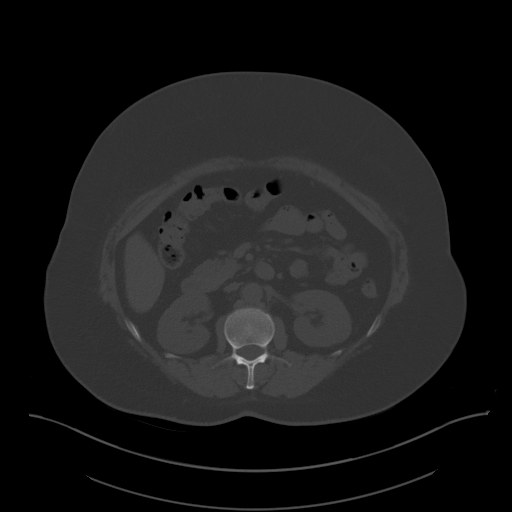
[im 73/101  soft-tissue]
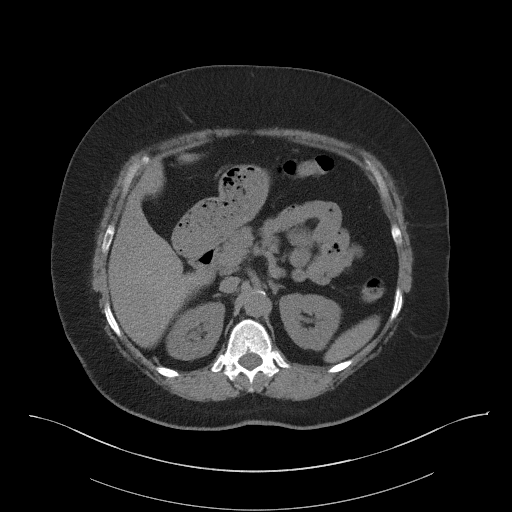
[im 81/101  soft-tissue]
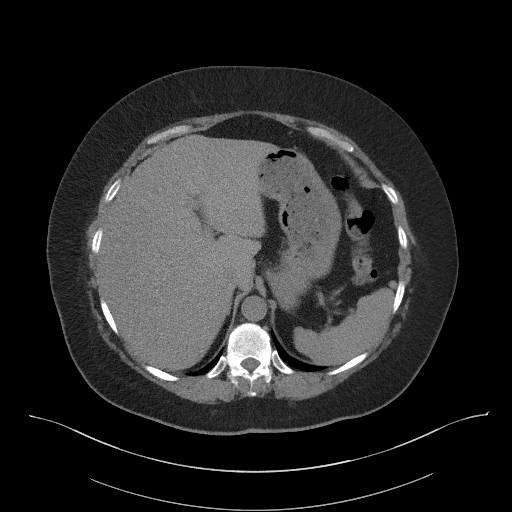
[im 89/101  soft-tissue]
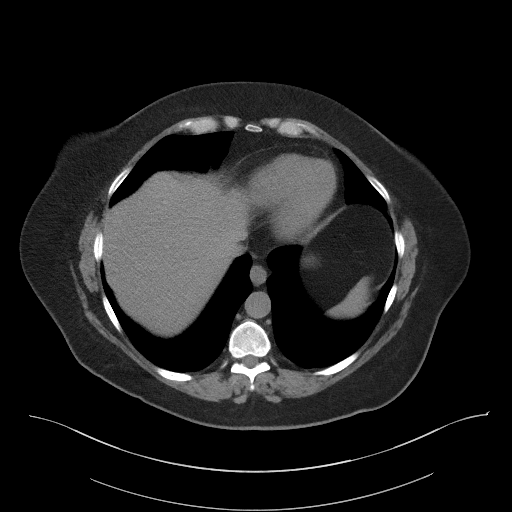
[im 97/101  soft-tissue]
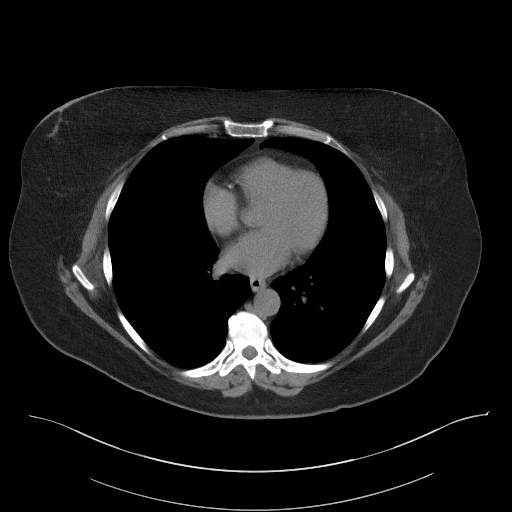

[Series 4: coronal st · coronal · 0.75mm/px · 3 of 79 slices shown]
[im 27/79  soft-tissue]
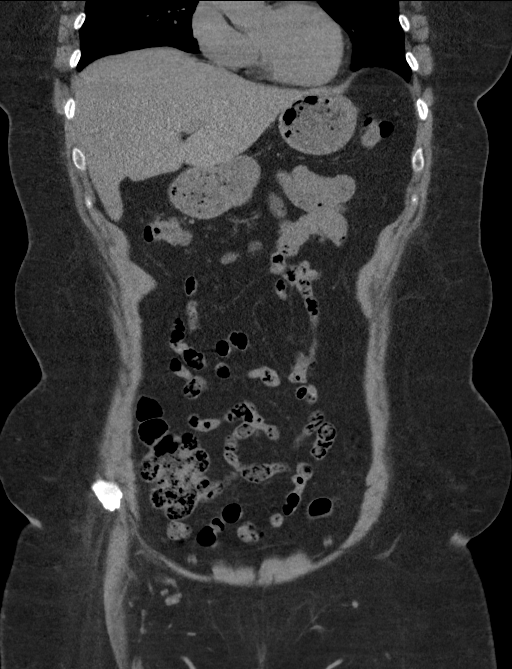
[im 35/79  soft-tissue]
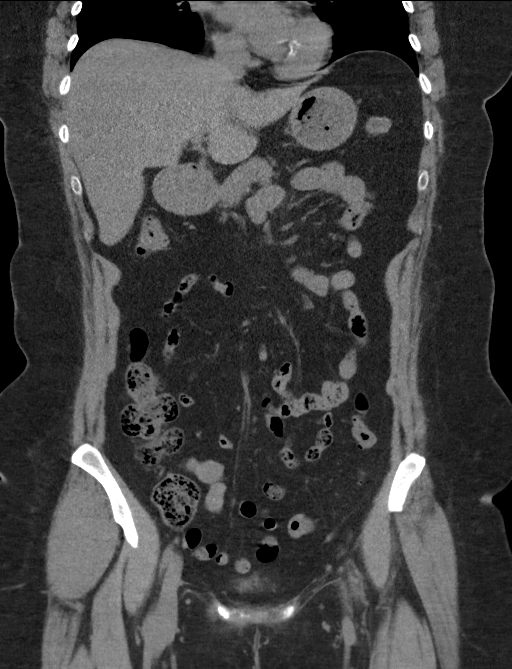
[im 44/79  soft-tissue]
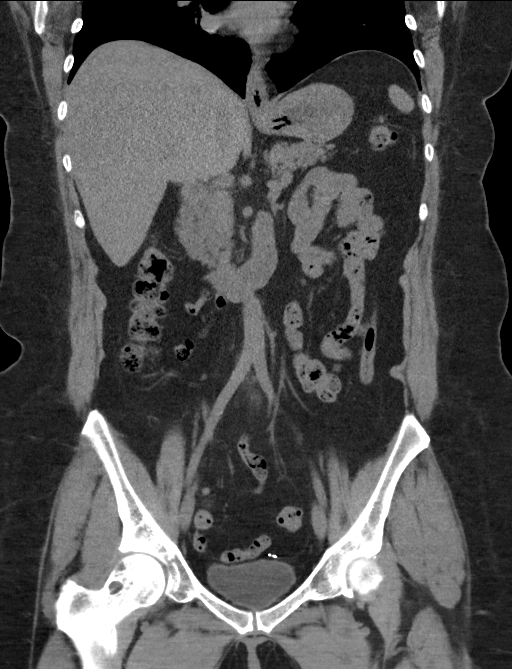

[16 of 46 positions shown; findings below may reference images not displayed]

FINDINGS: Lower chest: The lung bases are clear.

Hepatobiliary: No focal liver abnormality is seen. Status post
cholecystectomy. No biliary dilatation.

Pancreas: Unremarkable. No pancreatic ductal dilatation or
surrounding inflammatory changes.

Spleen: Normal in size without focal abnormality.

Adrenals/Urinary Tract: Adrenal glands are unremarkable. Kidneys are
normal, without renal calculi, focal lesion, or hydronephrosis.
There is a tiny gas bubble in the bladder. This is nonspecific but
could indicate infection. No bladder wall thickening.

Stomach/Bowel: Stomach is within normal limits. Appendix appears
normal. Diverticula in the sigmoid colon. No evidence of bowel wall
thickening, distention, or inflammatory changes.

Vascular/Lymphatic: Aortic atherosclerosis. No enlarged abdominal or
pelvic lymph nodes.

Reproductive: Status post hysterectomy. No adnexal masses.

Other: No abdominal wall hernia or abnormality. No abdominopelvic
ascites.

Musculoskeletal: No acute or significant osseous findings.
IMPRESSION: 1. No renal or ureteral stone or obstruction.
2. Tiny dot of gas in the bladder is nonspecific but could indicate
infection. No bladder wall thickening.
3. Colonic diverticula without evidence of diverticulitis. No
evidence of bowel obstruction or inflammation.
4. Mild aortic atherosclerosis.

## 2019-08-19 ENCOUNTER — Other Ambulatory Visit: Payer: Self-pay

## 2019-08-19 ENCOUNTER — Encounter (HOSPITAL_BASED_OUTPATIENT_CLINIC_OR_DEPARTMENT_OTHER): Payer: Self-pay | Admitting: Emergency Medicine

## 2019-08-19 ENCOUNTER — Emergency Department (HOSPITAL_BASED_OUTPATIENT_CLINIC_OR_DEPARTMENT_OTHER)
Admission: EM | Admit: 2019-08-19 | Discharge: 2019-08-20 | Disposition: A | Discharge status: Home / Self Care | Payer: Medicare Other | Attending: Emergency Medicine | Admitting: Emergency Medicine

## 2019-08-19 DIAGNOSIS — Z9861 Coronary angioplasty status: Secondary | ICD-10-CM | POA: Insufficient documentation

## 2019-08-19 DIAGNOSIS — Z7982 Long term (current) use of aspirin: Secondary | ICD-10-CM | POA: Diagnosis not present

## 2019-08-19 DIAGNOSIS — G35 Multiple sclerosis: Secondary | ICD-10-CM | POA: Diagnosis not present

## 2019-08-19 DIAGNOSIS — Z888 Allergy status to other drugs, medicaments and biological substances status: Secondary | ICD-10-CM | POA: Diagnosis not present

## 2019-08-19 DIAGNOSIS — Z881 Allergy status to other antibiotic agents status: Secondary | ICD-10-CM | POA: Insufficient documentation

## 2019-08-19 DIAGNOSIS — Z79899 Other long term (current) drug therapy: Secondary | ICD-10-CM | POA: Diagnosis not present

## 2019-08-19 DIAGNOSIS — Z885 Allergy status to narcotic agent status: Secondary | ICD-10-CM | POA: Insufficient documentation

## 2019-08-19 DIAGNOSIS — Z887 Allergy status to serum and vaccine status: Secondary | ICD-10-CM | POA: Diagnosis not present

## 2019-08-19 DIAGNOSIS — R0789 Other chest pain: Secondary | ICD-10-CM

## 2019-08-19 DIAGNOSIS — I480 Paroxysmal atrial fibrillation: Secondary | ICD-10-CM | POA: Insufficient documentation

## 2019-08-19 DIAGNOSIS — J45909 Unspecified asthma, uncomplicated: Secondary | ICD-10-CM | POA: Insufficient documentation

## 2019-08-19 DIAGNOSIS — Z91041 Radiographic dye allergy status: Secondary | ICD-10-CM | POA: Insufficient documentation

## 2019-08-19 MED ORDER — DILTIAZEM HCL 25 MG/5ML IV SOLN
20.0000 mg | Freq: Once | INTRAVENOUS | Status: AC
Start: 2019-08-19 — End: 2019-08-20
  Administered 2019-08-20: 20 mg via INTRAVENOUS
  Filled 2019-08-19: qty 5

## 2019-08-19 NOTE — ED Notes (Signed)
Attempted PIV 22G in left AC, unsuccessful

## 2019-08-19 NOTE — ED Provider Notes (Signed)
[facility] Provider Note: [name], MD, FACEP  CSN: [id number] MRN: [id number] ARRIVAL: 08/19/19 at 2307 ROOM: [facility]03/[facility]03   CHIEF COMPLAINT  Chest Pain   HISTORY OF PRESENT ILLNESS  08/19/19 11:31 PM Valerie Juarez is a 59 y.o. female who had a cardiac catheterization yesterday at [facility] which showed 40 to 50% blockage of the LAD.  No interventions were done.  She was started on Lipitor.  She is here now feeling like her heart is racing which began about 10 minutes prior to arrival.  She is having chest pain on the left side which she describes as discomfort and she rates it as a 10 out of 10.  She has some mild shortness of breath with this as well as lightheadedness.  She has been nauseated all day but this has not acutely changed.  She denies diaphoresis.  EKG on arrival showed A. fib with RVR and rate in the 140s.  As the patient was being moved to her room she spontaneously converted and is now in normal sinus rhythm.  Her chest discomfort has decreased to a 6 out of 10.   Past Medical History:  Diagnosis Date  . Asthma   . History of cystocele   . Migraines   . MS (multiple sclerosis) (HCC)   . Pseudocholinesterase deficiency   . Rectocele     Past Surgical History:  Procedure Laterality Date  . ABDOMINAL HYSTERECTOMY    . CHOLECYSTECTOMY      No family history on file.  Social History   Tobacco Use  . Smoking status: Never Smoker  . Smokeless tobacco: Never Used  Substance Use Topics  . Alcohol use: No  . Drug use: No    Prior to Admission medications   Medication Sig Start Date End Date Taking? Authorizing Provider  aspirin EC 81 MG tablet Take 81 mg by mouth daily.   Yes [provider]  cetirizine (ZYRTEC) 10 MG tablet Take 10 mg by mouth daily.   Yes [provider]  esomeprazole (NEXIUM) 40 MG capsule Take 40 mg by mouth 2 (two) times daily before a meal.   Yes [provider]  fluticasone (FLOVENT DISKUS) 50  MCG/BLIST diskus inhaler Inhale 1 puff into the lungs 2 (two) times daily.   Yes [provider]  ketorolac (TORADOL) 10 MG tablet 60 mg every 6 (six) hours as needed for severe pain (this is for severe headache and is IM injection).   Yes [provider]  metroNIDAZOLE (METROGEL) 1 % gel Apply topically at bedtime as needed (alternate with metro cream).   Yes [provider]  montelukast (SINGULAIR) 10 MG tablet Take 10 mg by mouth at bedtime.   Yes [provider]  propranolol (INDERAL) 10 MG tablet Take 20 mg by mouth every evening.   Yes [provider]  propranolol (INDERAL) 80 MG tablet Take 80 mg by mouth daily.   Yes [provider]  tretinoin (RETIN-A) 0.025 % gel Apply topically at bedtime.   Yes [provider]  triamcinolone cream (KENALOG) 0.1 % Apply 1 application topically 2 (two) times daily.   Yes [provider]  vitamin B-12 (CYANOCOBALAMIN) 1000 MCG tablet Take 1,000 mcg by mouth daily.   Yes [provider]  VITAMIN D, ERGOCALCIFEROL, PO Take 5,000 Units by mouth daily.   Yes [provider]  clonazePAM (KLONOPIN) 1 MG tablet Take 1 mg by mouth 2 (two) times daily as needed for anxiety (for  severe headache).    [provider]  diazepam (VALIUM) 5 MG tablet Take 5 mg by mouth every 6 (six) hours as needed for anxiety.    [provider]  diphenhydrAMINE (BENADRYL) 50 MG capsule Take 50 mg by mouth every 6 (six) hours as needed (anxiety and allergic reactions).    [provider]  EPINEPHrine 0.3 mg/0.3 mL IJ SOAJ injection Inject into the muscle once.    [provider]  estradiol (CLIMARA - DOSED IN MG/24 HR) 0.025 mg/24hr patch Place 0.25 mg onto the skin once a week.    [provider]  famotidine (PEPCID) 40 MG tablet Take 40 mg by mouth as needed for heartburn or indigestion.    [provider]  fluorometholone (FML) 0.1 % ophthalmic  suspension Place 1 drop into both eyes daily.    [provider]  HYDROcodone-acetaminophen (NORCO) 5-325 MG tablet Take 1 tablet by mouth every 6 (six) hours as needed (for pain). 04/08/17   Achaia Garlock, MD  levalbuterol [facility] HFA) 45 MCG/ACT inhaler Inhale into the lungs every 4 (four) hours as needed for wheezing or shortness of breath.    [provider]  levalbuterol Penne Lash) 1.25 MG/3ML nebulizer solution Take 1.25 mg by nebulization every 4 (four) hours as needed for wheezing.    [provider]  ranitidine (ZANTAC) 150 MG tablet Take 150 mg by mouth 2 (two) times daily.  08/20/19  [provider]    Allergies Atenolol, Codeine, Contrast media [iodinated diagnostic agents], Desyrel [trazodone], Morphine and related, Premarin [conjugated estrogens], Prozac [fluoxetine hcl], Tetanus toxoids, and Vancomycin   REVIEW OF SYSTEMS  Negative except as noted here or in the History of Present Illness.   PHYSICAL EXAMINATION  Initial Vital Signs Blood pressure (!) 151/126, pulse (!) 146, temperature 97.9 F (36.6 C), temperature source Oral, resp. rate 18, height 5' 4.5" (1.638 m), weight 102.5 kg, SpO2 100 %.  Examination General: Well-developed, well-nourished female in no acute distress; appearance consistent with age of record HENT: normocephalic; atraumatic Eyes: pupils equal, round and reactive to light; extraocular muscles intact Neck: supple Heart: regular rate and rhythm; no murmur Lungs: clear to auscultation bilaterally Abdomen: soft; nondistended; nontender; bowel sounds present Extremities: No deformity; full range of motion; pulses normal Neurologic: Awake, alert and oriented; motor function intact in all extremities and symmetric; no facial droop Skin: Warm and dry Psychiatric: Anxious   RESULTS  Summary of this visit's results, reviewed and interpreted by myself:   EKG Interpretation  Date/Time:  Saturday Aug 19 2019 23:11:39  EDT Ventricular Rate:  149 PR Interval:    QRS Duration: 70 QT Interval:  260 QTC Calculation: 409 R Axis:   41 Text Interpretation: Atrial fibrillation with rapid ventricular response Septal infarct , age undetermined Abnormal ECG Previously NSR Confirmed by Shanon Rosser [phone]) on 08/19/2019 11:29:02 PM       EKG Interpretation  Date/Time:  Saturday Aug 19 2019 23:38:26 EDT Ventricular Rate:  87 PR Interval:    QRS Duration: 85 QT Interval:  360 QTC Calculation: 433 R Axis:   34 Text Interpretation: Sinus rhythm Low voltage, precordial leads Anteroseptal infarct, age indeterminate Minimal ST depression, inferior leads Previously AFIB with RVR Confirmed by Shanon Rosser [phone]) on 08/19/2019 11:41:01 PM       Laboratory Studies: Results for orders placed or performed during the hospital encounter of 08/19/19 (from the past 24 hour(s))  CBC with Differential/Platelet     Status: Abnormal   Collection  Time: 08/20/19 12:34 AM  Result Value Ref Range   WBC 12.2 (H) 4.0 - 10.5 K/uL   RBC 4.57 3.87 - 5.11 MIL/uL   Hemoglobin 12.9 12.0 - 15.0 g/dL   HCT 37.9 02.4 - 09.7 %   MCV 89.3 80.0 - 100.0 fL   MCH 28.2 26.0 - 34.0 pg   MCHC 31.6 30.0 - 36.0 g/dL   RDW 35.3 29.9 - 24.2 %   Platelets 331 150 - 400 K/uL   nRBC 0.0 0.0 - 0.2 %   Neutrophils Relative % 62 %   Neutro Abs 7.5 1.7 - 7.7 K/uL   Lymphocytes Relative 30 %   Lymphs Abs 3.7 0.7 - 4.0 K/uL   Monocytes Relative 7 %   Monocytes Absolute 0.9 0.1 - 1.0 K/uL   Eosinophils Relative 1 %   Eosinophils Absolute 0.1 0.0 - 0.5 K/uL   Basophils Relative 0 %   Basophils Absolute 0.0 0.0 - 0.1 K/uL   Immature Granulocytes 0 %   Abs Immature Granulocytes 0.05 0.00 - 0.07 K/uL  Basic metabolic panel     Status: Abnormal   Collection Time: 08/20/19 12:34 AM  Result Value Ref Range   Sodium 142 135 - 145 mmol/L   Potassium 3.4 (L) 3.5 - 5.1 mmol/L   Chloride 107 98 - 111 mmol/L   CO2 24 22 - 32 mmol/L   Glucose, Bld 117 (H)  70 - 99 mg/dL   BUN 22 (H) 6 - 20 mg/dL   Creatinine, Ser 6.83 (H) 0.44 - 1.00 mg/dL   Calcium 9.0 8.9 - 41.9 mg/dL   GFR calc non Af Amer 43 (L) >60 mL/min   GFR calc Af Amer 50 (L) >60 mL/min   Anion gap 11 5 - 15  Troponin I (High Sensitivity)     Status: Abnormal   Collection Time: 08/20/19 12:34 AM  Result Value Ref Range   Troponin I (High Sensitivity) 35 (H) <18 ng/L  Troponin I (High Sensitivity)     Status: Abnormal   Collection Time: 08/20/19  2:39 AM  Result Value Ref Range   Troponin I (High Sensitivity) 44 (H) <18 ng/L  Troponin I (High Sensitivity)     Status: Abnormal   Collection Time: 08/20/19  4:28 AM  Result Value Ref Range   Troponin I (High Sensitivity) 30 (H) <18 ng/L   Imaging Studies: No results found.  ED COURSE and MDM  Nursing notes, initial and subsequent vitals signs, including pulse oximetry, reviewed and interpreted by myself.  Vitals:   08/20/19 0111 08/20/19 0230 08/20/19 0330 08/20/19 0500  BP:  119/72 124/72 108/69  Pulse: 82 67 72 70  Resp: 20 16 17 19   Temp:      TempSrc:      SpO2: 93% 100% 99% 98%  Weight:      Height:       Medications  nitroGLYCERIN (NITROSTAT) SL tablet 0.4 mg (0.4 mg Sublingual Given 08/20/19 0102)  diltiazem (CARDIZEM) injection 20 mg (20 mg Intravenous Given 08/20/19 0046)   1:49 AM Patient complains of continued chest pain which was not relieved with 3 sublingual nitroglycerin.  This pain is similar to the pain that occasioned her recent catheterization.  She has been having similar pains since early April.  She has had multiple studies including a PET scan, echocardiogram and 2-week heart monitoring.  3:57 AM Patient still complains of pain that is waxing and waning.  It is often sharp in nature.  As noted  above it was not relieved with nitroglycerin.  It is not worse with deep breathing or with movement.  5:32 AM Patient still having the left upper chest and shoulder pain but without shortness of breath,  nausea or diaphoresis.  Her troponins are mildly elevated peaking and now in proving after her atrial fibrillation with RVR.  This likely represents demand ischemia.  As the levels are waning I do not believe they are associated with her chest pain which, as noted above, has been going on for multiple weeks with no evidence of cardiac etiology.  I believe that she has been ruled out for an acute coronary event and it is safe to send her home.  She is comfortable with this with the understanding that she will return should palpitations recur or other symptoms occur or worsen.  Her pain may be an atypical presentation of her multiple sclerosis.  She states she has had improvement with her pain with high doses of prednisone in the past with return after her prednisone dose was decreased.   PROCEDURES  Procedures   ED DIAGNOSES     ICD-10-CM   1. Paroxysmal atrial fibrillation (HCC)  I48.0   2. Atypical chest pain  R07.89        Paula Libra, MD 08/20/19 [phone]

## 2019-08-19 NOTE — ED Triage Notes (Signed)
Reports having chest pain in April. Was at [facility] for 4 days.  Had cardiac cath yesterday found to have 40-50% blockage of LAD found.  No interventions done at that time. Started on Lipitor.  Reports feeling like heart was racing.  No hx of afib.  Does report getting solumedrol yesterday.

## 2019-08-20 LAB — CBC WITH DIFFERENTIAL/PLATELET
Abs Immature Granulocytes: 0.05 10*3/uL (ref 0.00–0.07)
Basophils Absolute: 0 10*3/uL (ref 0.0–0.1)
Basophils Relative: 0 %
Eosinophils Absolute: 0.1 10*3/uL (ref 0.0–0.5)
Eosinophils Relative: 1 %
HCT: 40.8 % (ref 36.0–46.0)
Hemoglobin: 12.9 g/dL (ref 12.0–15.0)
Immature Granulocytes: 0 %
Lymphocytes Relative: 30 %
Lymphs Abs: 3.7 10*3/uL (ref 0.7–4.0)
MCH: 28.2 pg (ref 26.0–34.0)
MCHC: 31.6 g/dL (ref 30.0–36.0)
MCV: 89.3 fL (ref 80.0–100.0)
Monocytes Absolute: 0.9 10*3/uL (ref 0.1–1.0)
Monocytes Relative: 7 %
Neutro Abs: 7.5 10*3/uL (ref 1.7–7.7)
Neutrophils Relative %: 62 %
Platelets: 331 10*3/uL (ref 150–400)
RBC: 4.57 MIL/uL (ref 3.87–5.11)
RDW: 15.3 % (ref 11.5–15.5)
WBC: 12.2 10*3/uL — ABNORMAL HIGH (ref 4.0–10.5)
nRBC: 0 % (ref 0.0–0.2)

## 2019-08-20 LAB — BASIC METABOLIC PANEL
Anion gap: 11 (ref 5–15)
BUN: 22 mg/dL — ABNORMAL HIGH (ref 6–20)
CO2: 24 mmol/L (ref 22–32)
Calcium: 9 mg/dL (ref 8.9–10.3)
Chloride: 107 mmol/L (ref 98–111)
Creatinine, Ser: 1.35 mg/dL — ABNORMAL HIGH (ref 0.44–1.00)
GFR calc Af Amer: 50 mL/min — ABNORMAL LOW (ref >60)
GFR calc non Af Amer: 43 mL/min — ABNORMAL LOW (ref >60)
Glucose, Bld: 117 mg/dL — ABNORMAL HIGH (ref 70–99)
Potassium: 3.4 mmol/L — ABNORMAL LOW (ref 3.5–5.1)
Sodium: 142 mmol/L (ref 135–145)

## 2019-08-20 LAB — TROPONIN I (HIGH SENSITIVITY)
Troponin I (High Sensitivity): 35 ng/L — ABNORMAL HIGH (ref <18)
Troponin I (High Sensitivity): 44 ng/L — ABNORMAL HIGH (ref <18)
Troponin I (High Sensitivity): 30 ng/L — ABNORMAL HIGH (ref <18)

## 2019-08-20 MED ORDER — PROPRANOLOL HCL 20 MG PO TABS
20.00 | ORAL_TABLET | ORAL | Status: DC
Start: 2019-08-18 — End: 2019-08-20

## 2019-08-20 MED ORDER — CETIRIZINE HCL 10 MG PO TABS
10.00 | ORAL_TABLET | ORAL | Status: DC
Start: 2019-08-19 — End: 2019-08-20

## 2019-08-20 MED ORDER — MONTELUKAST SODIUM 10 MG PO TABS
10.00 | ORAL_TABLET | ORAL | Status: DC
Start: 2019-08-19 — End: 2019-08-20

## 2019-08-20 MED ORDER — FLUTICASONE PROPIONATE 50 MCG/ACT NA SUSP
2.00 | NASAL | Status: DC
Start: 2019-08-19 — End: 2019-08-20

## 2019-08-20 MED ORDER — ATORVASTATIN CALCIUM 40 MG PO TABS
40.00 | ORAL_TABLET | ORAL | Status: DC
Start: 2019-08-19 — End: 2019-08-20

## 2019-08-20 MED ORDER — FAMOTIDINE 40 MG PO TABS
40.00 | ORAL_TABLET | ORAL | Status: DC
Start: ? — End: 2019-08-20

## 2019-08-20 MED ORDER — NITROGLYCERIN 0.4 MG SL SUBL
0.4000 mg | SUBLINGUAL_TABLET | SUBLINGUAL | Status: DC | PRN
  Administered 2019-08-20 (×2): 0.4 mg via SUBLINGUAL

## 2019-08-20 MED ORDER — ENOXAPARIN SODIUM 40 MG/0.4ML ~~LOC~~ SOLN
40.00 | SUBCUTANEOUS | Status: DC
Start: 2019-08-18 — End: 2019-08-20

## 2019-08-20 MED ORDER — ASPIRIN 81 MG PO CHEW
81.00 | CHEWABLE_TABLET | ORAL | Status: DC
Start: 2019-08-19 — End: 2019-08-20

## 2019-08-20 MED ORDER — PANTOPRAZOLE SODIUM 40 MG PO TBEC
40.00 | DELAYED_RELEASE_TABLET | ORAL | Status: DC
Start: 2019-08-19 — End: 2019-08-20

## 2019-08-20 MED ORDER — NITROGLYCERIN 0.4 MG SL SUBL
SUBLINGUAL_TABLET | SUBLINGUAL | Status: AC
  Administered 2019-08-20: 0.4 mg via SUBLINGUAL
  Filled 2019-08-20: qty 3

## 2019-10-20 ENCOUNTER — Encounter (HOSPITAL_BASED_OUTPATIENT_CLINIC_OR_DEPARTMENT_OTHER): Payer: Self-pay

## 2019-10-20 ENCOUNTER — Emergency Department (HOSPITAL_BASED_OUTPATIENT_CLINIC_OR_DEPARTMENT_OTHER)
Admission: EM | Admit: 2019-10-20 | Discharge: 2019-10-20 | Disposition: A | Discharge status: Home / Self Care | Payer: Medicare Other | Attending: Emergency Medicine | Admitting: Emergency Medicine

## 2019-10-20 ENCOUNTER — Other Ambulatory Visit: Payer: Self-pay

## 2019-10-20 DIAGNOSIS — G35 Multiple sclerosis: Secondary | ICD-10-CM | POA: Insufficient documentation

## 2019-10-20 DIAGNOSIS — J45909 Unspecified asthma, uncomplicated: Secondary | ICD-10-CM | POA: Diagnosis not present

## 2019-10-20 DIAGNOSIS — R112 Nausea with vomiting, unspecified: Secondary | ICD-10-CM | POA: Insufficient documentation

## 2019-10-20 DIAGNOSIS — A059 Bacterial foodborne intoxication, unspecified: Secondary | ICD-10-CM | POA: Diagnosis not present

## 2019-10-20 DIAGNOSIS — R197 Diarrhea, unspecified: Secondary | ICD-10-CM | POA: Diagnosis not present

## 2019-10-20 LAB — COMPREHENSIVE METABOLIC PANEL
ALT: 17 U/L (ref 0–44)
AST: 23 U/L (ref 15–41)
Albumin: 3.9 g/dL (ref 3.5–5.0)
Alkaline Phosphatase: 66 U/L (ref 38–126)
Anion gap: 12 (ref 5–15)
BUN: 14 mg/dL (ref 6–20)
CO2: 23 mmol/L (ref 22–32)
Calcium: 8.7 mg/dL — ABNORMAL LOW (ref 8.9–10.3)
Chloride: 106 mmol/L (ref 98–111)
Creatinine, Ser: 0.49 mg/dL (ref 0.44–1.00)
GFR calc Af Amer: >60 mL/min (ref >60)
GFR calc non Af Amer: >60 mL/min (ref >60)
Glucose, Bld: 142 mg/dL — ABNORMAL HIGH (ref 70–99)
Potassium: 3.3 mmol/L — ABNORMAL LOW (ref 3.5–5.1)
Sodium: 141 mmol/L (ref 135–145)
Total Bilirubin: 0.6 mg/dL (ref 0.3–1.2)
Total Protein: 7.4 g/dL (ref 6.5–8.1)

## 2019-10-20 LAB — LIPASE, BLOOD: Lipase: 29 U/L (ref 11–51)

## 2019-10-20 LAB — CBC
HCT: 41 % (ref 36.0–46.0)
Hemoglobin: 13.1 g/dL (ref 12.0–15.0)
MCH: 28.1 pg (ref 26.0–34.0)
MCHC: 32 g/dL (ref 30.0–36.0)
MCV: 87.8 fL (ref 80.0–100.0)
Platelets: 272 10*3/uL (ref 150–400)
RBC: 4.67 MIL/uL (ref 3.87–5.11)
RDW: 14.4 % (ref 11.5–15.5)
WBC: 8.8 10*3/uL (ref 4.0–10.5)
nRBC: 0 % (ref 0.0–0.2)

## 2019-10-20 MED ORDER — CIPROFLOXACIN HCL 500 MG PO TABS
500.0000 mg | ORAL_TABLET | Freq: Two times a day (BID) | ORAL | 0 refills | Status: AC
Start: 2019-10-20 — End: 2019-10-25

## 2019-10-20 MED ORDER — SODIUM CHLORIDE 0.9 % IV BOLUS
1000.0000 mL | Freq: Once | INTRAVENOUS | Status: AC
  Administered 2019-10-20: 1000 mL via INTRAVENOUS

## 2019-10-20 MED ORDER — METOCLOPRAMIDE HCL 5 MG/ML IJ SOLN: 10.0000 mg | Freq: Once | INTRAMUSCULAR | Status: DC

## 2019-10-20 MED ORDER — FAMOTIDINE IN NACL 20-0.9 MG/50ML-% IV SOLN
20.0000 mg | Freq: Once | INTRAVENOUS | Status: AC
  Administered 2019-10-20: 20 mg via INTRAVENOUS
  Filled 2019-10-20: qty 50

## 2019-10-20 MED ORDER — PROMETHAZINE HCL 25 MG/ML IJ SOLN
25.0000 mg | Freq: Once | INTRAMUSCULAR | Status: AC
  Administered 2019-10-20: 25 mg via INTRAVENOUS
  Filled 2019-10-20: qty 1

## 2019-10-20 MED ORDER — ONDANSETRON 4 MG PO TBDP: 4.0000 mg | ORAL_TABLET | Freq: Three times a day (TID) | ORAL | 0 refills | Status: AC | PRN

## 2019-10-20 MED ORDER — ONDANSETRON HCL 4 MG/2ML IJ SOLN
4.0000 mg | Freq: Once | INTRAMUSCULAR | Status: AC
  Administered 2019-10-20: 4 mg via INTRAVENOUS
  Filled 2019-10-20: qty 2

## 2019-10-20 NOTE — ED Triage Notes (Signed)
Pt arrives via [facility] from home with c/o NVD starting around midnight. Pt states that she thinks it may be food related reporting that her son is having similar symptoms.   4mg  Zofran given IV PTA.

## 2019-10-20 NOTE — Discharge Instructions (Addendum)
You were evaluated in the Emergency Department and after careful evaluation, we did not find any emergent condition requiring admission or further testing in the hospital.  Your exam/testing today was overall reassuring.  We suspect your symptoms are due to food poisoning.  We are treating you for possible Salmonella related diarrhea.  Please take the ciprofloxacin twice daily as directed.  Please use the Zofran medication as needed for nausea to help you drink water at home.  Please return to the Emergency Department if you experience any worsening of your condition.  Thank you for allowing [place] to be a part of your care.

## 2019-10-20 NOTE — ED Provider Notes (Signed)
[facility] Emergency Department Provider Note MRN:  [id number]  Arrival date & time: 10/20/19     Chief Complaint   Emesis   History of Present Illness   Valerie Juarez is a 59 y.o. year-old female with a history of multiple sclerosis presenting to the ED with chief complaint of emesis.  Patient ate at a new restaurant and a new bakery yesterday evening and that midnight began experiencing persistent nausea, vomiting, diarrhea.  Watery mucus stool, no blood, no blood in the vomit.  Countless episodes of each, has soiled through nearly all of her clothes at home.  Patient's son ate the same food and is having similar symptoms.  No recent travel, no recent antibiotics.  Review of Systems  A complete 10 system review of systems was obtained and all systems are negative except as noted in the HPI and PMH.   Patient's Health History    Past Medical History:  Diagnosis Date  . Asthma   . History of cystocele   . Migraines   . MS (multiple sclerosis) (HCC)   . Pseudocholinesterase deficiency   . Rectocele     Past Surgical History:  Procedure Laterality Date  . ABDOMINAL HYSTERECTOMY    . CHOLECYSTECTOMY      No family history on file.  Social History   Socioeconomic History  . Marital status: Divorced    Spouse name: Not on file  . Number of children: Not on file  . Years of education: Not on file  . Highest education level: Not on file  Occupational History  . Not on file  Tobacco Use  . Smoking status: Never Smoker  . Smokeless tobacco: Never Used  Substance and Sexual Activity  . Alcohol use: No  . Drug use: No  . Sexual activity: Not on file  Other Topics Concern  . Not on file  Social History Narrative  . Not on file   Social Determinants of Health   Financial Resource Strain:   . Difficulty of Paying Living Expenses:   Food Insecurity:   . Worried About Programme researcher, broadcasting/film/video in the Last Year:   . Barista in the Last  Year:   Transportation Needs:   . Freight forwarder (Medical):   Marland Kitchen Lack of Transportation (Non-Medical):   Physical Activity:   . Days of Exercise per Week:   . Minutes of Exercise per Session:   Stress:   . Feeling of Stress :   Social Connections:   . Frequency of Communication with Friends and Family:   . Frequency of Social Gatherings with Friends and Family:   . Attends Religious Services:   . Active Member of Clubs or Organizations:   . Attends Banker Meetings:   Marland Kitchen Marital Status:   Intimate Partner Violence:   . Fear of Current or Ex-Partner:   . Emotionally Abused:   Marland Kitchen Physically Abused:   . Sexually Abused:      Physical Exam   Vitals:   10/20/19 1300 10/20/19 1330  BP: (!) 143/86 (!) 120/91  Pulse: 95 (!) 110  Resp: (!) 22 (!) 26  Temp:    SpO2: 97% 99%    CONSTITUTIONAL: Well-appearing, NAD NEURO:  Alert and oriented x 3, no focal deficits EYES:  eyes equal and reactive ENT/NECK:  no LAD, no JVD CARDIO: Regular rate, well-perfused, normal S1 and S2 PULM:  CTAB no wheezing or rhonchi GI/GU:  normal bowel sounds, non-distended, non-tender  MSK/SPINE:  No gross deformities, no edema SKIN:  no rash, atraumatic PSYCH:  Appropriate speech and behavior  *Additional and/or pertinent findings included in MDM below  Diagnostic and Interventional Summary    EKG Interpretation  Date/Time:  Friday October 20 2019 10:05:30 EDT Ventricular Rate:  93 PR Interval:    QRS Duration: 83 QT Interval:  342 QTC Calculation: 426 R Axis:   14 Text Interpretation: Sinus rhythm Low voltage, precordial leads Borderline repolarization abnormality No significant change was found Confirmed by Kennis Carina [phone]) on 10/20/2019 10:31:13 AM      Labs Reviewed  COMPREHENSIVE METABOLIC PANEL - Abnormal; Notable for the following components:      Result Value   Potassium 3.3 (*)    Glucose, Bld 142 (*)    Calcium 8.7 (*)    All other components within normal  limits  GASTROINTESTINAL PANEL BY PCR, STOOL (REPLACES STOOL CULTURE)  CBC  LIPASE, BLOOD    No orders to display    Medications  ondansetron (ZOFRAN) injection 4 mg (4 mg Intravenous Given 10/20/19 1006)  sodium chloride 0.9 % bolus 1,000 mL (0 mLs Intravenous Stopped 10/20/19 1251)  sodium chloride 0.9 % bolus 1,000 mL (0 mLs Intravenous Stopped 10/20/19 1108)  famotidine (PEPCID) IVPB 20 mg premix (0 mg Intravenous Stopped 10/20/19 1105)  promethazine (PHENERGAN) injection 25 mg (25 mg Intravenous Given 10/20/19 1259)     Procedures  /  Critical Care Procedures  ED Course and Medical Decision Making  I have reviewed the triage vital signs, the nursing notes, and pertinent available records from the EMR.  Listed above are laboratory and imaging tests that I personally ordered, reviewed, and interpreted and then considered in my medical decision making (see below for details).      Consistent with food poisoning, preformed bacterial toxin or Salmonella.  Patient's dinner contained chicken.  Reassuring vital signs and exam, attempting hydration, symptomatic management and discharge.  Work-up is reassuring, no leukocytosis, no AKI.  Repeated abdominal exams are reassuring, no indication for imaging.  Patient is feeling well enough for discharge.  Given age and severity of symptoms, will cover with ciprofloxacin.  Elmer Sow. Pilar Plate, MD [facility] Emergency Medicine [facility] [email or web address]  Final Clinical Impressions(s) / ED Diagnoses     ICD-10-CM   1. Nausea vomiting and diarrhea  R11.2    R19.7   2. Food poisoning  A05.9     ED Discharge Orders         Ordered    ciprofloxacin (CIPRO) 500 MG tablet  Every 12 hours     Discontinue  Reprint     10/20/19 1437    ondansetron (ZOFRAN ODT) 4 MG disintegrating tablet  Every 8 hours PRN     Discontinue  Reprint     10/20/19 1437           Discharge Instructions Discussed with and Provided to Patient:      Discharge Instructions     You were evaluated in the Emergency Department and after careful evaluation, we did not find any emergent condition requiring admission or further testing in the hospital.  Your exam/testing today was overall reassuring.  We suspect your symptoms are due to food poisoning.  We are treating you for possible Salmonella related diarrhea.  Please take the ciprofloxacin twice daily as directed.  Please use the Zofran medication as needed for nausea to help you drink water at home.  Please return to the Emergency Department if  you experience any worsening of your condition.  Thank you for allowing [place] to be a part of your care.        Sabas Sous, MD 10/20/19 1440

## 2019-10-20 NOTE — ED Notes (Signed)
Given gingerale for po fluid challenge

## 2019-10-21 ENCOUNTER — Telehealth (HOSPITAL_BASED_OUTPATIENT_CLINIC_OR_DEPARTMENT_OTHER): Payer: Self-pay | Admitting: Emergency Medicine

## 2019-10-21 LAB — GASTROINTESTINAL PANEL BY PCR, STOOL (REPLACES STOOL CULTURE)
